# Patient Record
Sex: Female | Born: 1970 | Race: Black or African American | Hispanic: No | Marital: Married | Smoking: Never smoker
Health system: Southern US, Community
[De-identification: ages and names within clinical notes are randomized; demographics above are authoritative.]

## PROBLEM LIST (undated history)

## (undated) DIAGNOSIS — I10 Essential (primary) hypertension: Secondary | ICD-10-CM

## (undated) DIAGNOSIS — W3400XA Accidental discharge from unspecified firearms or gun, initial encounter: Secondary | ICD-10-CM

## (undated) DIAGNOSIS — D649 Anemia, unspecified: Secondary | ICD-10-CM

## (undated) DIAGNOSIS — A64 Unspecified sexually transmitted disease: Secondary | ICD-10-CM

## (undated) HISTORY — DX: Essential (primary) hypertension: I10

## (undated) HISTORY — PX: TUBAL LIGATION: SHX77

## (undated) HISTORY — DX: Unspecified sexually transmitted disease: A64

## (undated) HISTORY — DX: Anemia, unspecified: D64.9

---

## 2011-08-04 ENCOUNTER — Emergency Department (HOSPITAL_COMMUNITY): Payer: Managed Care, Other (non HMO)

## 2011-08-04 ENCOUNTER — Inpatient Hospital Stay (HOSPITAL_COMMUNITY)
Admission: EM | Admit: 2011-08-04 | Discharge: 2011-08-07 | DRG: 074 | Disposition: A | Discharge status: Home Health | Payer: Managed Care, Other (non HMO) | Attending: General Surgery | Admitting: General Surgery

## 2011-08-04 DIAGNOSIS — S91009A Unspecified open wound, unspecified ankle, initial encounter: Secondary | ICD-10-CM

## 2011-08-04 DIAGNOSIS — S79929A Unspecified injury of unspecified thigh, initial encounter: Secondary | ICD-10-CM

## 2011-08-04 DIAGNOSIS — Z79899 Other long term (current) drug therapy: Secondary | ICD-10-CM

## 2011-08-04 DIAGNOSIS — S5410XA Injury of median nerve at forearm level, unspecified arm, initial encounter: Principal | ICD-10-CM | POA: Diagnosis present

## 2011-08-04 DIAGNOSIS — S8990XA Unspecified injury of unspecified lower leg, initial encounter: Secondary | ICD-10-CM

## 2011-08-04 DIAGNOSIS — S41109A Unspecified open wound of unspecified upper arm, initial encounter: Secondary | ICD-10-CM

## 2011-08-04 DIAGNOSIS — S61409A Unspecified open wound of unspecified hand, initial encounter: Secondary | ICD-10-CM | POA: Diagnosis present

## 2011-08-04 DIAGNOSIS — W3400XA Accidental discharge from unspecified firearms or gun, initial encounter: Secondary | ICD-10-CM

## 2011-08-04 DIAGNOSIS — Y92009 Unspecified place in unspecified non-institutional (private) residence as the place of occurrence of the external cause: Secondary | ICD-10-CM

## 2011-08-04 DIAGNOSIS — S5411XA Injury of median nerve at forearm level, right arm, initial encounter: Secondary | ICD-10-CM

## 2011-08-04 DIAGNOSIS — Y249XXA Unspecified firearm discharge, undetermined intent, initial encounter: Secondary | ICD-10-CM

## 2011-08-04 DIAGNOSIS — S81009A Unspecified open wound, unspecified knee, initial encounter: Secondary | ICD-10-CM

## 2011-08-04 DIAGNOSIS — S71009A Unspecified open wound, unspecified hip, initial encounter: Secondary | ICD-10-CM | POA: Diagnosis present

## 2011-08-04 DIAGNOSIS — I10 Essential (primary) hypertension: Secondary | ICD-10-CM

## 2011-08-04 DIAGNOSIS — S71109A Unspecified open wound, unspecified thigh, initial encounter: Secondary | ICD-10-CM | POA: Diagnosis present

## 2011-08-04 DIAGNOSIS — D62 Acute posthemorrhagic anemia: Secondary | ICD-10-CM

## 2011-08-04 DIAGNOSIS — S6990XA Unspecified injury of unspecified wrist, hand and finger(s), initial encounter: Secondary | ICD-10-CM

## 2011-08-04 DIAGNOSIS — S81809A Unspecified open wound, unspecified lower leg, initial encounter: Secondary | ICD-10-CM

## 2011-08-04 HISTORY — DX: Essential (primary) hypertension: I10

## 2011-08-04 LAB — CBC
HCT: 31.4 % — ABNORMAL LOW (ref 36.0–46.0)
MCH: 23 pg — ABNORMAL LOW (ref 26.0–34.0)
MCH: 22.8 pg — ABNORMAL LOW (ref 26.0–34.0)
MCHC: 31.8 g/dL (ref 30.0–36.0)
MCV: 71.5 fL — ABNORMAL LOW (ref 78.0–100.0)
Platelets: 169 10*3/uL (ref 150–400)
RBC: 4.52 MIL/uL (ref 3.87–5.11)
RDW: 16.3 % — ABNORMAL HIGH (ref 11.5–15.5)
RDW: 16.4 % — ABNORMAL HIGH (ref 11.5–15.5)
WBC: 14.4 10*3/uL — ABNORMAL HIGH (ref 4.0–10.5)

## 2011-08-04 LAB — URINALYSIS, MICROSCOPIC ONLY
Bilirubin Urine: NEGATIVE
Ketones, ur: NEGATIVE mg/dL
Leukocytes, UA: NEGATIVE
Nitrite: NEGATIVE
Specific Gravity, Urine: 1.02 (ref 1.005–1.030)
Urobilinogen, UA: 2 mg/dL — ABNORMAL HIGH (ref 0.0–1.0)
pH: 7.5 (ref 5.0–8.0)

## 2011-08-04 LAB — PROTIME-INR
INR: 1.09 (ref 0.00–1.49)
Prothrombin Time: 14.3 seconds (ref 11.6–15.2)

## 2011-08-04 LAB — TYPE AND SCREEN
ABO/RH(D): B POS
Antibody Screen: NEGATIVE
Unit division: 0

## 2011-08-04 LAB — POCT I-STAT, CHEM 8
Creatinine, Ser: 0.8 mg/dL (ref 0.50–1.10)
HCT: 33 % — ABNORMAL LOW (ref 36.0–46.0)
Hemoglobin: 11.2 g/dL — ABNORMAL LOW (ref 12.0–15.0)
Potassium: 3.5 mEq/L (ref 3.5–5.1)
Sodium: 142 mEq/L (ref 135–145)
TCO2: 23 mmol/L (ref 0–100)

## 2011-08-04 LAB — COMPREHENSIVE METABOLIC PANEL
Alkaline Phosphatase: 69 U/L (ref 39–117)
BUN: 11 mg/dL (ref 6–23)
CO2: 24 mEq/L (ref 19–32)
Chloride: 106 mEq/L (ref 96–112)
Creatinine, Ser: 0.76 mg/dL (ref 0.50–1.10)
GFR calc non Af Amer: >90 mL/min (ref >90)
Potassium: 3.5 mEq/L (ref 3.5–5.1)
Total Bilirubin: 0.3 mg/dL (ref 0.3–1.2)

## 2011-08-04 LAB — ABO/RH: ABO/RH(D): B POS

## 2011-08-04 LAB — SAMPLE TO BLOOD BANK

## 2011-08-04 MED ORDER — HYDROMORPHONE HCL PF 1 MG/ML IJ SOLN
1.0000 mg | INTRAMUSCULAR | Status: DC | PRN
  Administered 2011-08-04 – 2011-08-05 (×5): 1 mg via INTRAVENOUS
  Filled 2011-08-04 (×5): qty 1

## 2011-08-04 MED ORDER — HYDROCODONE-ACETAMINOPHEN 5-325 MG PO TABS
2.0000 | ORAL_TABLET | ORAL | Status: DC | PRN
  Administered 2011-08-05 – 2011-08-07 (×5): 2 via ORAL
  Filled 2011-08-04 (×4): qty 2

## 2011-08-04 MED ORDER — ONDANSETRON HCL 4 MG/2ML IJ SOLN: 4.0000 mg | Freq: Four times a day (QID) | INTRAMUSCULAR | Status: DC | PRN

## 2011-08-04 MED ORDER — ENOXAPARIN SODIUM 40 MG/0.4ML ~~LOC~~ SOLN
40.0000 mg | SUBCUTANEOUS | Status: DC
  Administered 2011-08-04 – 2011-08-06 (×3): 40 mg via SUBCUTANEOUS
  Filled 2011-08-04 (×4): qty 0.4

## 2011-08-04 MED ORDER — ONDANSETRON HCL 4 MG PO TABS: 4.0000 mg | ORAL_TABLET | Freq: Four times a day (QID) | ORAL | Status: DC | PRN

## 2011-08-04 MED ORDER — TETANUS-DIPHTH-ACELL PERTUSSIS 5-2.5-18.5 LF-MCG/0.5 IM SUSP
0.5000 mL | Freq: Once | INTRAMUSCULAR | Status: AC
  Administered 2011-08-04: 0.5 mL via INTRAMUSCULAR

## 2011-08-04 MED ORDER — SODIUM CHLORIDE 0.9 % IV SOLN
INTRAVENOUS | Status: DC
  Administered 2011-08-04: 16:00:00 via INTRAVENOUS
  Filled 2011-08-04 (×2): qty 1000

## 2011-08-04 MED ORDER — MORPHINE SULFATE 2 MG/ML IJ SOLN: 2.0000 mg | INTRAMUSCULAR | Status: DC | PRN

## 2011-08-04 MED ORDER — FENTANYL CITRATE 0.05 MG/ML IJ SOLN
INTRAMUSCULAR | Status: AC
  Filled 2011-08-04: qty 2

## 2011-08-04 MED ORDER — ACETAMINOPHEN 325 MG PO TABS: 650.0000 mg | ORAL_TABLET | ORAL | Status: DC | PRN

## 2011-08-04 MED ORDER — DOCUSATE SODIUM 100 MG PO CAPS
100.0000 mg | ORAL_CAPSULE | Freq: Two times a day (BID) | ORAL | Status: DC
  Administered 2011-08-04 – 2011-08-07 (×7): 100 mg via ORAL
  Filled 2011-08-04 (×9): qty 1

## 2011-08-04 MED ORDER — BISACODYL 10 MG RE SUPP: 10.0000 mg | Freq: Every day | RECTAL | Status: DC | PRN

## 2011-08-04 MED ORDER — FENTANYL CITRATE 0.05 MG/ML IJ SOLN
50.0000 ug | Freq: Once | INTRAMUSCULAR | Status: AC
  Administered 2011-08-04: 50 ug via INTRAVENOUS

## 2011-08-04 MED ORDER — LISINOPRIL 10 MG PO TABS
10.0000 mg | ORAL_TABLET | Freq: Every day | ORAL | Status: DC
  Administered 2011-08-04 – 2011-08-07 (×4): 10 mg via ORAL
  Filled 2011-08-04 (×4): qty 1

## 2011-08-04 MED ORDER — HYDROCODONE-ACETAMINOPHEN 5-325 MG PO TABS
1.0000 | ORAL_TABLET | ORAL | Status: DC | PRN
Start: 2011-08-04 — End: 2011-08-07
  Administered 2011-08-05: 1 via ORAL
  Filled 2011-08-04: qty 1
  Filled 2011-08-04: qty 2

## 2011-08-04 MED ORDER — FENTANYL CITRATE 0.05 MG/ML IJ SOLN
50.0000 ug | Freq: Once | INTRAMUSCULAR | Status: AC
  Administered 2011-08-04: 50 ug via INTRAVENOUS
  Filled 2011-08-04: qty 2

## 2011-08-04 MED ORDER — CEFAZOLIN SODIUM 1-5 GM-% IV SOLN
1.0000 g | Freq: Three times a day (TID) | INTRAVENOUS | Status: AC
  Administered 2011-08-04 – 2011-08-05 (×3): 1 g via INTRAVENOUS
  Filled 2011-08-04 (×3): qty 50

## 2011-08-04 NOTE — ED Notes (Signed)
Medicated
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
## 2011-08-04 NOTE — ED Notes (Signed)
Wound care provided by techs.
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
## 2011-08-04 NOTE — ED Notes (Signed)
Asked pt for urine said she "could not go right now"
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
## 2011-08-04 NOTE — ED Notes (Signed)
Pt in and out for urine, tampon removed and placed in maternity underwear and pad. nad ntoed, given blanket, pillow and sheet and footies.
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
## 2011-08-05 DIAGNOSIS — D62 Acute posthemorrhagic anemia: Secondary | ICD-10-CM | POA: Diagnosis not present

## 2011-08-05 LAB — BASIC METABOLIC PANEL WITH GFR
BUN: 14 mg/dL (ref 6–23)
CO2: 26 meq/L (ref 19–32)
Calcium: 8.8 mg/dL (ref 8.4–10.5)
Chloride: 103 meq/L (ref 96–112)
Creatinine, Ser: 0.72 mg/dL (ref 0.50–1.10)
GFR calc Af Amer: >90 mL/min
GFR calc non Af Amer: >90 mL/min
Glucose, Bld: 105 mg/dL — ABNORMAL HIGH (ref 70–99)
Potassium: 4.1 meq/L (ref 3.5–5.1)
Sodium: 137 meq/L (ref 135–145)

## 2011-08-05 LAB — CBC
HCT: 29.6 % — ABNORMAL LOW (ref 36.0–46.0)
Hemoglobin: 9.2 g/dL — ABNORMAL LOW (ref 12.0–15.0)
MCH: 22.4 pg — ABNORMAL LOW (ref 26.0–34.0)
MCHC: 31.1 g/dL (ref 30.0–36.0)
MCV: 72 fL — ABNORMAL LOW (ref 78.0–100.0)
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
## 2011-08-05 NOTE — Progress Notes (Signed)
Physical Therapy Evaluation Patient Details Name: Patricia Barker MRN: [id number] DOB: 19-Jun-1971 Today's Date: 08/05/2011  Problem List: There is no problem list on file for this patient.   Past Medical History:  Past Medical History  Diagnosis Date  . Hypertension    Past Surgical History:  Past Surgical History  Procedure Date  . Tubal ligation     PT Assessment/Plan/Recommendation PT Assessment Clinical Impression Statement: Pt requires approximately moderate assistance for transfers and very short ambulation distances. Expect pt will progress quickly. Pt's daughters are in their mid 36s and can help pt 24 hours a day when she gets home. Will adjust DME as pt progresses.  PT Recommendation/Assessment: Patient will need skilled PT in the acute care venue PT Problem List: Decreased strength;Decreased activity tolerance;Decreased balance;Decreased mobility;Decreased knowledge of use of DME;Impaired sensation PT Therapy Diagnosis : Difficulty walking;Abnormality of gait;Generalized weakness;Acute pain PT Plan PT Frequency: Min 6X/week PT Treatment/Interventions: DME instruction;Gait training;Functional mobility training;Therapeutic exercise;Balance training;Patient/family education PT Recommendation Follow Up Recommendations: Home health PT Equipment Recommended:  (Rolling walker with 5" wheels with Rt.UE platform) PT Goals  Acute Rehab PT Goals PT Goal Formulation: With patient Time For Goal Achievement: 2 weeks Pt will go Supine/Side to Sit: with modified independence PT Goal: Supine/Side to Sit - Progress: Progressing toward goal Pt will go Sit to Supine/Side: with modified independence PT Goal: Sit to Supine/Side - Progress: Progressing toward goal Pt will Transfer Bed to Chair/Chair to Bed: with supervision PT Transfer Goal: Bed to Chair/Chair to Bed - Progress: Progressing toward goal Pt will Ambulate: 16 - 50 feet PT Goal: Ambulate - Progress: Progressing toward  goal  PT Evaluation Precautions/Restrictions  Restrictions Weight Bearing Restrictions: Yes RLE Weight Bearing: Weight bearing as tolerated LLE Weight Bearing: Weight bearing as tolerated Prior Functioning  Home Living Lives With: Family (3 children) Receives Help From: Family Type of Home: House Home Layout: Two level;Full bath on main level;Able to live on main level with bedroom/bathroom Alternate Level Stairs-Rails: Right Alternate Level Stairs-Number of Steps: 12 Home Access: Level entry Bathroom Shower/Tub: Engineer, manufacturing systems: Standard Home Adaptive Equipment: None Prior Function Level of Independence: Independent with basic ADLs Driving: Yes Vocation: Full time employment 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 Grossly >3/5 LLE Assessment LLE Assessment: Exceptions to [facility] LLE Strength LLE Overall Strength: Deficits;Due to pain LLE Overall Strength Comments: Generalized decreased strength, difficult to assess secondary to pain. Grossly >3/5 Mobility (including Balance) Bed Mobility Bed Mobility: Yes Supine to Sit: 4: Min assist Supine to Sit Details (indicate cue type and reason): Verbal and tactile cues for sequence, use of Rt. elbow to advance self. Min Assist secondary to pain and weakness. Sitting - Scoot to Edge of Bed: 5: Supervision Sitting - Scoot to [place] of Bed Details (indicate cue type and reason): Verbal cues for sequence and to continue without assistance. (Pt requesting help from her  daughter) Transfers Transfers: Yes Sit to Stand: 3: Mod assist Sit to Stand Details (indicate cue type and reason): Verbal and tactile cues for Bil. UE placement Stand to Sit: 3: Mod assist Stand to Sit Details:  Pt anxious, Verbal and tactile cues for Bil. UE  placement, use of armrests Ambulation/Gait Ambulation/Gait: Yes Ambulation/Gait Assistance: 3: Mod assist Ambulation/Gait Assistance Details (indicate cue type and reason): Verbal cues to increase weight bearing to Lt. LE, verbal cues for use of platform walker.  Ambulation Distance (Feet): 4 Feet Assistive device: Right platform walker Gait Pattern: Decreased weight shift to left;Decreased stance time - left;Decreased stride length;Decreased step length - left (Pt tends to wiggle Rt.foot forwards and not take a step. ) Stairs: No Wheelchair Mobility Wheelchair Mobility: No  Posture/Postural Control Posture/Postural Control: No significant limitations Balance Balance Assessed: Yes Static Sitting Balance Static Sitting - Balance Support: No upper extremity supported;Feet supported Static Sitting - Level of Assistance: 6: Modified independent (Device/Increase time) Exercise    End of Session PT - End of Session Equipment Utilized During Treatment: Gait belt Activity Tolerance: Patient limited by pain;Patient limited by fatigue Patient left: in chair;with call bell in reach;with family/visitor present Nurse Communication: Mobility status for transfers;Mobility status for ambulation;Weight bearing status (need for platform walker, transfer to Rt. ) General Behavior During Session: [facility] for tasks performed Cognition: [facility] for tasks performed  Cheek, Dahlia Client Carleene Mains, Salome Coz[place]) 08/05/2011, 9:07 AM

## 2011-08-06 DIAGNOSIS — S5411XA Injury of median nerve at forearm level, right arm, initial encounter: Secondary | ICD-10-CM | POA: Diagnosis present

## 2011-08-06 DIAGNOSIS — I10 Essential (primary) hypertension: Secondary | ICD-10-CM | POA: Insufficient documentation

## 2011-08-06 MED ORDER — HYDROMORPHONE HCL PF 1 MG/ML IJ SOLN: 0.5000 mg | INTRAMUSCULAR | Status: DC | PRN

## 2011-08-06 NOTE — Progress Notes (Signed)
Patient examined and I agree with the assessment and plan I notified RN care manager of form patient needs for work  [facility] E

## 2011-08-06 NOTE — Progress Notes (Signed)
Occupational Therapy Evaluation Patient Details Name: Patricia Barker MRN: [id number] DOB: April 17, 1971 Today's Date: 08/06/2011  OT Assessment/Plan OT Assessment/Plan Comments on Treatment Session: Pt wanted to focus session on R hand (Pt declined any sit to stand activity with OT. ) OT Plan: Discharge plan remains appropriate OT Frequency: Min 2X/week Equipment Recommended: Rolling walker with 5" wheels (Rolling walker with 5" wheels with Rt.UE platform) OT Goals ADL Goals Pt Will Transfer to Toilet: Other (comment) (pt declined toilet transfer) ADL Goal: Toilet Transfer - Progress: Progressing toward goals Arm Goals Pt Will Perform AROM: 2 sets;10 reps;to maintain range of motion (to decrease and prevent swelling) Arm Goal: AROM - Progress: Progressing toward goal Additional Arm Goal #1: R hand was not elevated on pillow upon OT entering the room.  Educated pt on importance of elevation to prevent swelling inthe fingers  OT Treatment Precautions/Restrictions  Restrictions Weight Bearing Restrictions: Yes RUE Weight Bearing: Weight bear through elbow only RLE Weight Bearing: Weight bearing as tolerated LLE Weight Bearing: Weight bearing as tolerated   ADL   Mobility  Bed Mobility Bed Mobility: Yes Supine to Sit: 5: Supervision Supine to Sit Details (indicate cue type and reason): Verbal cues for efficiency Sitting - Scoot to Edge of Bed: 5: Supervision Sitting - Scoot to [place] of Bed Details (indicate cue type and reason): Verbal cues for efficiency and placement of UEs  Transfers Sit to Stand: 4: Min assist Sit to Stand Details (indicate cue type and reason): Verbal cues for pushing from bed with Lt.UE, negotiating platform attachment Stand to Sit: 5: Supervision Stand to Sit Details: Verbal cues for pushing from bed with Lt.UE, negotiating platform attachment Exercises General Exercises - Lower Extremity Hand Exercises Digit Composite Flexion: AROM;10  reps;Right;Seated Composite Extension: AROM;10 reps;Seated Thumb Abduction: 10 reps;AROM;Seated Thumb Adduction: AAROM;10 reps;Seated  End of Session OT - End of Session Activity Tolerance: Patient tolerated treatment well Patient left: in chair General Behavior During Session: [facility] for tasks performed Cognition: [facility] for tasks performed  Kirt Boys  08/06/2011, 2:26 PM
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
## 2011-08-07 LAB — CBC
MCH: 22.3 pg — ABNORMAL LOW (ref 26.0–34.0)
MCHC: 31.1 g/dL (ref 30.0–36.0)
MCV: 71.8 fL — ABNORMAL LOW (ref 78.0–100.0)
Platelets: 215 10*3/uL (ref 150–400)
RBC: 3.94 MIL/uL (ref 3.87–5.11)
RDW: 16.4 % — ABNORMAL HIGH (ref 11.5–15.5)

## 2011-08-07 MED ORDER — PROMETHAZINE HCL 12.5 MG PO TABS: 12.5000 mg | ORAL_TABLET | Freq: Four times a day (QID) | ORAL | Status: DC | PRN

## 2011-08-07 MED ORDER — HYDROCODONE-ACETAMINOPHEN 5-325 MG PO TABS: 1.0000 | ORAL_TABLET | ORAL | Status: DC | PRN

## 2011-08-07 NOTE — Progress Notes (Signed)
Spoke with the patient who is prepared to go home with her daughter, to stay at her cousin's home.  This patient has been seen and I agree with the findings and treatment plan.  Marta Lamas. Gae Bon, MD, FACS [phone] (pager) [phone] (direct pager) Trauma Surgeon

## 2011-08-07 NOTE — Progress Notes (Signed)
Occupational Therapy Evaluation Patient Details Name: Patricia Barker MRN: [id number] DOB: 1971-07-02 Today's Date: 08/07/2011  OT Assessment/Plan OT Assessment/Plan Comments on Treatment Session: Pt. focused session on R hand and declined any out of chair activity. OT Plan: Discharge plan remains appropriate OT Frequency: Min 2X/week Follow Up Recommendations: Home health OT Equipment Recommended: 3 in 1 bedside comode OT Goals ADL Goals Pt Will Transfer to Toilet: Other (comment) (pt. declined toilet transfer) ADL Goal: Toilet Transfer - Progress: Not addressed ADL Goal: Tub/Shower Transfer - Progress: Not addressed Arm Goals Arm Goal: AROM - Progress: Progressing toward goal Additional Arm Goal #1: Rt. hand not elevated on pillow when OT arrives.  OT re-educated patient on edema control and elevated right hand on pillow.  OT Treatment Precautions/Restrictions  Restrictions RUE Weight Bearing: Weight bear through elbow only RLE Weight Bearing: Weight bearing as tolerated LLE Weight Bearing: Weight bearing as tolerated   ADL ADL Eating/Feeding: Not assessed Grooming: Not assessed Upper Body Bathing: Not assessed Lower Body Bathing: Not assessed Upper Body Dressing: Not assessed Lower Body Dressing: Not assessed Toilet Transfer: Not assessed Toileting - Clothing Manipulation: Not assessed Toileting - Hygiene: Not assessed Tub/Shower Transfer: Not assessed ADL Comments: Pt. declines ADLs this session.  She and her family are waiting to go home. Mobility  Bed Mobility Bed Mobility: No Transfers Transfers: No Exercises Hand Exercises Digit Composite Flexion: AROM;10 reps;Right;Seated (pt. reports doing exercises 10 times/day) Composite Extension: AROM;Right;10 reps;Seated Thumb Abduction: AAROM;10 reps;Right;Seated Thumb Adduction: AAROM;Right;10 reps;Seated  End of Session OT - End of Session Activity Tolerance: Patient tolerated treatment well Patient left:  in chair;with call bell in reach;with family/visitor present Nurse Communication: Other (comment) (Notified RN for pain meds) General Behavior During Session: [facility] for tasks performed Cognition: [facility] for tasks performed  Victorhugo Preis Kirsten Beatrice-Mitchell  08/07/2011, 4:26 PM  08/07/2011 Kailena Lubas K. Beatrice-Mitchell, MS, OTR/L Occupational Therapist Acute Rehabilitation [facility] [place] Phone: [phone] Pager: [phone] [email or web address]
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
## 2011-08-07 NOTE — Progress Notes (Signed)
Pt presented with choices for HH/DME.  Instructed that provider contract for DME is with [employer]. Pt agreeable to that. HH with [facility] arranged, HHPT. 3-n-1 and right platform rolling walker arranged. Fax sent to [employer]. Receipt confirmed by telephone.  Short term disability papers completed and signed by Jimmye Norman, MD.  Determination of at least 4 months out of work given by Dr. Betha Loa initially until he can reassess wounds at follow up appointment.
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
## 2011-08-07 NOTE — Progress Notes (Signed)
Call placed to the number provided from patient for her short term disability and FMLA paperwork.  Left my pager number and the trauma office fax number for her to be abke to contact me or to just fax the info.   Number from pt:  Patricia Barker at [place], [phone] [employer] ID [id number] [phone] Health Plan [id number] Group [phone]

## 2011-08-13 ENCOUNTER — Encounter (HOSPITAL_COMMUNITY): Payer: Self-pay | Admitting: Emergency Medicine

## 2011-08-13 ENCOUNTER — Emergency Department (HOSPITAL_COMMUNITY)
Admission: EM | Admit: 2011-08-13 | Discharge: 2011-08-13 | Disposition: A | Discharge status: Home / Self Care | Payer: Managed Care, Other (non HMO) | Attending: Emergency Medicine | Admitting: Emergency Medicine

## 2011-08-13 ENCOUNTER — Emergency Department (HOSPITAL_COMMUNITY): Payer: Managed Care, Other (non HMO)

## 2011-08-13 DIAGNOSIS — S71109A Unspecified open wound, unspecified thigh, initial encounter: Secondary | ICD-10-CM | POA: Insufficient documentation

## 2011-08-13 DIAGNOSIS — I10 Essential (primary) hypertension: Secondary | ICD-10-CM | POA: Insufficient documentation

## 2011-08-13 DIAGNOSIS — S71009A Unspecified open wound, unspecified hip, initial encounter: Secondary | ICD-10-CM | POA: Insufficient documentation

## 2011-08-13 DIAGNOSIS — M79609 Pain in unspecified limb: Secondary | ICD-10-CM | POA: Insufficient documentation

## 2011-08-13 DIAGNOSIS — S79929A Unspecified injury of unspecified thigh, initial encounter: Secondary | ICD-10-CM

## 2011-08-13 MED ORDER — OXYCODONE-ACETAMINOPHEN 5-325 MG PO TABS: 2.0000 | ORAL_TABLET | ORAL | Status: DC | PRN
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
## 2011-08-13 NOTE — ED Notes (Signed)
Pt resting quietly and reports that she is ready to go home.

## 2011-08-22 ENCOUNTER — Ambulatory Visit (INDEPENDENT_AMBULATORY_CARE_PROVIDER_SITE_OTHER): Payer: Managed Care, Other (non HMO) | Admitting: Orthopedic Surgery

## 2011-08-22 ENCOUNTER — Encounter (INDEPENDENT_AMBULATORY_CARE_PROVIDER_SITE_OTHER): Payer: Self-pay

## 2011-08-22 VITALS — BP 118/88 | HR 68 | Temp 97.8°F | Resp 18 | Ht 67.0 in | Wt 217.0 lb

## 2011-08-22 DIAGNOSIS — T148XXA Other injury of unspecified body region, initial encounter: Secondary | ICD-10-CM

## 2011-08-22 DIAGNOSIS — W3400XA Accidental discharge from unspecified firearms or gun, initial encounter: Secondary | ICD-10-CM
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
## 2011-12-15 ENCOUNTER — Emergency Department (HOSPITAL_COMMUNITY)
Admission: EM | Admit: 2011-12-15 | Discharge: 2011-12-15 | Disposition: A | Discharge status: Home / Self Care | Payer: Managed Care, Other (non HMO) | Attending: Emergency Medicine | Admitting: Emergency Medicine

## 2011-12-15 ENCOUNTER — Encounter (HOSPITAL_COMMUNITY): Payer: Self-pay

## 2011-12-15 DIAGNOSIS — M545 Low back pain, unspecified: Secondary | ICD-10-CM | POA: Insufficient documentation

## 2011-12-15 DIAGNOSIS — Y92410 Unspecified street and highway as the place of occurrence of the external cause: Secondary | ICD-10-CM | POA: Insufficient documentation

## 2011-12-15 DIAGNOSIS — I10 Essential (primary) hypertension: Secondary | ICD-10-CM | POA: Insufficient documentation

## 2011-12-15 HISTORY — DX: Accidental discharge from unspecified firearms or gun, initial encounter: W34.00XA

## 2011-12-15 MED ORDER — NAPROXEN 500 MG PO TABS: 500.0000 mg | ORAL_TABLET | Freq: Two times a day (BID) | ORAL | Status: AC

## 2011-12-15 MED ORDER — CYCLOBENZAPRINE HCL 10 MG PO TABS: 10.0000 mg | ORAL_TABLET | Freq: Two times a day (BID) | ORAL | Status: AC | PRN
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
## 2011-12-15 NOTE — Discharge Instructions (Signed)
Will be sore for several days. No x-rays are necessary. Medication for pain and muscle spasm.
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
## 2017-02-10 ENCOUNTER — Encounter (HOSPITAL_COMMUNITY): Payer: Managed Care, Other (non HMO) | Attending: Oncology | Admitting: Oncology

## 2017-02-10 ENCOUNTER — Encounter (HOSPITAL_COMMUNITY): Payer: Managed Care, Other (non HMO)

## 2017-02-10 ENCOUNTER — Encounter (HOSPITAL_COMMUNITY): Payer: Self-pay

## 2017-02-10 DIAGNOSIS — N920 Excessive and frequent menstruation with regular cycle: Secondary | ICD-10-CM | POA: Diagnosis not present

## 2017-02-10 DIAGNOSIS — D509 Iron deficiency anemia, unspecified: Secondary | ICD-10-CM | POA: Diagnosis not present

## 2017-02-10 LAB — CBC WITH DIFFERENTIAL/PLATELET
BASOS PCT: 1 %
Basophils Absolute: 0.1 10*3/uL (ref 0.0–0.1)
EOS ABS: 0.1 10*3/uL (ref 0.0–0.7)
Eosinophils Relative: 1 %
HEMATOCRIT: 32.4 % — AB (ref 36.0–46.0)
HEMOGLOBIN: 9.7 g/dL — AB (ref 12.0–15.0)
LYMPHS PCT: 27 %
Lymphs Abs: 1.9 10*3/uL (ref 0.7–4.0)
MCH: 19.6 pg — AB (ref 26.0–34.0)
MCHC: 29.9 g/dL — AB (ref 30.0–36.0)
MCV: 65.3 fL — AB (ref 78.0–100.0)
MONOS PCT: 6 %
Monocytes Absolute: 0.4 10*3/uL (ref 0.1–1.0)
NEUTROS ABS: 4.7 10*3/uL (ref 1.7–7.7)
Neutrophils Relative %: 65 %
Platelets: 230 10*3/uL (ref 150–400)
RBC: 4.96 MIL/uL (ref 3.87–5.11)
RDW: 21.5 % — ABNORMAL HIGH (ref 11.5–15.5)
WBC: 7.2 10*3/uL (ref 4.0–10.5)

## 2017-02-10 LAB — COMPREHENSIVE METABOLIC PANEL
ALK PHOS: 50 U/L (ref 38–126)
ALT: 13 U/L — ABNORMAL LOW (ref 14–54)
ANION GAP: 6 (ref 5–15)
AST: 15 U/L (ref 15–41)
Albumin: 3.7 g/dL (ref 3.5–5.0)
BILIRUBIN TOTAL: 0.4 mg/dL (ref 0.3–1.2)
BUN: 8 mg/dL (ref 6–20)
CO2: 25 mmol/L (ref 22–32)
Calcium: 8.8 mg/dL — ABNORMAL LOW (ref 8.9–10.3)
Chloride: 105 mmol/L (ref 101–111)
Creatinine, Ser: 0.75 mg/dL (ref 0.44–1.00)
Glucose, Bld: 96 mg/dL (ref 65–99)
Potassium: 4 mmol/L (ref 3.5–5.1)
Sodium: 136 mmol/L (ref 135–145)
TOTAL PROTEIN: 7.5 g/dL (ref 6.5–8.1)

## 2017-02-10 LAB — IRON AND TIBC
Iron: 24 ug/dL — ABNORMAL LOW (ref 28–170)
SATURATION RATIOS: 6 % — AB (ref 10.4–31.8)
TIBC: 393 ug/dL (ref 250–450)
UIBC: 369 ug/dL

## 2017-02-10 LAB — FOLATE: Folate: 14.7 ng/mL (ref >5.9)

## 2017-02-10 LAB — RETICULOCYTES
RBC.: 4.96 MIL/uL (ref 3.87–5.11)
RETIC COUNT ABSOLUTE: 59.5 10*3/uL (ref 19.0–186.0)
RETIC CT PCT: 1.2 % (ref 0.4–3.1)

## 2017-02-10 LAB — FERRITIN: Ferritin: 6 ng/mL — ABNORMAL LOW (ref 11–307)

## 2017-02-10 LAB — LACTATE DEHYDROGENASE: LDH: 115 U/L (ref 98–192)

## 2017-02-10 LAB — VITAMIN B12: Vitamin B-12: 316 pg/mL (ref 180–914)

## 2017-02-10 MED ORDER — FERROUS SULFATE 325 (65 FE) MG PO TBEC: 325.0000 mg | DELAYED_RELEASE_TABLET | Freq: Three times a day (TID) | ORAL | 5 refills | Status: DC

## 2017-02-10 NOTE — Patient Instructions (Addendum)
[place] at [facility] Discharge Instructions  RECOMMENDATIONS MADE BY THE CONSULTANT AND ANY TEST RESULTS WILL BE SENT TO YOUR REFERRING PHYSICIAN.  You were seen today by Dr. Twana First Lab work today Follow up in one week   Thank you for choosing [place] at [facility] to provide your oncology and hematology care.  To afford each patient quality time with our provider, please arrive at least 15 minutes before your scheduled appointment time.    If you have a lab appointment with the [facility] please come in thru the  Main Entrance and check in at the main information desk  You need to re-schedule your appointment should you arrive 10 or more minutes late.  We strive to give you quality time with our providers, and arriving late affects you and other patients whose appointments are after yours.  Also, if you no show three or more times for appointments you may be dismissed from the clinic at the providers discretion.     Again, thank you for choosing [facility].  Our hope is that these requests will decrease the amount of time that you wait before being seen by our physicians.       _____________________________________________________________  Should you have questions after your visit to [facility], please contact our office at [phone] between the hours of 8:30 a.m. and 4:30 p.m.  Voicemails left after 4:30 p.m. will not be returned until the following business day.  For prescription refill requests, have your pharmacy contact our office.       Resources For Cancer Patients and their Caregivers ? American Cancer Society: Can assist with transportation, wigs, general needs, runs Look Good Feel Better.        ([phone] ? Cancer Care: Provides financial assistance, online support groups, medication/co-pay assistance.  1-800-813-HOPE (336) 336-7800) ? [place] Assists  [place] cancer patients and their families through emotional , educational and financial support.  ([phone] ? [place] DSS Where to apply for food stamps, Medicaid and utility assistance. 703-009-1123 ? RCATS: Transportation to medical appointments. 307-471-6509 ? Social Security Administration: May apply for disability if have a Stage IV cancer. [phone] ? [employer], Disability and Transit Services: Assists with nutrition, care and transit needs. [place] Support Programs: @10RELATIVEDAYS @ > Cancer Support Group  2nd Tuesday of the month 1pm-2pm, Journey Room  > Creative Journey  3rd Tuesday of the month 1130am-1pm, Journey Room  > Look Good Feel Better  1st Wednesday of the month 10am-12 noon, Journey Room (Call [place] to register [phone])

## 2017-02-10 NOTE — Progress Notes (Signed)
Diane, Can you please call the patient and let her know that I got her iron studies back and they are low, so I want to try her on iron tablets first to see how her hemoglobin responds. I've sent in an Rx for ferrous sulfate 325mg  PO TID with meals. Please advise her to take a OTC stool softener should she get constipated and also to take her iron tablet with something acidic like orange juice to help with absorption. TY
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
## 2017-02-11 LAB — PROTEIN ELECTROPHORESIS, SERUM
A/G Ratio: 0.9 (ref 0.7–1.7)
ALPHA-1-GLOBULIN: 0.2 g/dL (ref 0.0–0.4)
ALPHA-2-GLOBULIN: 0.7 g/dL (ref 0.4–1.0)
Albumin ELP: 3.5 g/dL (ref 2.9–4.4)
BETA GLOBULIN: 1 g/dL (ref 0.7–1.3)
GAMMA GLOBULIN: 1.8 g/dL (ref 0.4–1.8)
Globulin, Total: 3.7 g/dL (ref 2.2–3.9)
Total Protein ELP: 7.2 g/dL (ref 6.0–8.5)

## 2017-02-11 LAB — HEMOGLOBINOPATHY EVALUATION
HGB A: 98.3 % (ref 96.4–98.8)
HGB C: 0 %
HGB F QUANT: 0 % (ref 0.0–2.0)
HGB VARIANT: 0 %
Hgb A2 Quant: 1.7 % — ABNORMAL LOW (ref 1.8–3.2)
Hgb S Quant: 0 %

## 2017-02-11 LAB — ERYTHROPOIETIN: Erythropoietin: 35.4 m[IU]/mL — ABNORMAL HIGH (ref 2.6–18.5)

## 2017-02-11 LAB — HAPTOGLOBIN: Haptoglobin: 166 mg/dL (ref 34–200)

## 2017-02-12 LAB — METHYLMALONIC ACID, SERUM: Methylmalonic Acid, Quantitative: 93 nmol/L (ref 0–378)

## 2017-02-12 LAB — IMMUNOFIXATION ELECTROPHORESIS
IgA: 85 mg/dL — ABNORMAL LOW (ref 87–352)
IgG (Immunoglobin G), Serum: 997 mg/dL (ref 700–1600)
IgM, Serum: 1132 mg/dL — ABNORMAL HIGH (ref 26–217)
Total Protein ELP: 7 g/dL (ref 6.0–8.5)

## 2017-02-18 ENCOUNTER — Encounter (HOSPITAL_BASED_OUTPATIENT_CLINIC_OR_DEPARTMENT_OTHER): Payer: Managed Care, Other (non HMO) | Admitting: Hematology

## 2017-02-18 ENCOUNTER — Encounter (HOSPITAL_COMMUNITY): Payer: Self-pay | Admitting: Hematology

## 2017-02-18 VITALS — BP 129/89 | HR 72 | Temp 98.4°F | Resp 16 | Wt 214.4 lb

## 2017-02-18 DIAGNOSIS — D890 Polyclonal hypergammaglobulinemia: Secondary | ICD-10-CM | POA: Diagnosis not present

## 2017-02-18 DIAGNOSIS — D509 Iron deficiency anemia, unspecified: Secondary | ICD-10-CM | POA: Diagnosis not present

## 2017-02-18 DIAGNOSIS — N920 Excessive and frequent menstruation with regular cycle: Secondary | ICD-10-CM

## 2017-02-18 NOTE — Patient Instructions (Signed)
[place] at [facility] Discharge Instructions  RECOMMENDATIONS MADE BY THE CONSULTANT AND ANY TEST RESULTS WILL BE SENT TO YOUR REFERRING PHYSICIAN.  You were seen today by Dr. Irene Limbo Follow up in 2 months with lab work Add a vitamin B-complex to your regimen take it daily   Thank you for choosing [place] at [facility] to provide your oncology and hematology care.  To afford each patient quality time with our provider, please arrive at least 15 minutes before your scheduled appointment time.    If you have a lab appointment with the [facility] please come in thru the  Main Entrance and check in at the main information desk  You need to re-schedule your appointment should you arrive 10 or more minutes late.  We strive to give you quality time with our providers, and arriving late affects you and other patients whose appointments are after yours.  Also, if you no show three or more times for appointments you may be dismissed from the clinic at the providers discretion.     Again, thank you for choosing [facility].  Our hope is that these requests will decrease the amount of time that you wait before being seen by our physicians.       _____________________________________________________________  Should you have questions after your visit to [facility], please contact our office at [phone] between the hours of 8:30 a.m. and 4:30 p.m.  Voicemails left after 4:30 p.m. will not be returned until the following business day.  For prescription refill requests, have your pharmacy contact our office.       Resources For Cancer Patients and their Caregivers ? American Cancer Society: Can assist with transportation, wigs, general needs, runs Look Good Feel Better.        ([phone] ? Cancer Care: Provides financial assistance, online support groups, medication/co-pay assistance.  1-800-813-HOPE  534-579-6596) ? [place] Assists [place] cancer patients and their families through emotional , educational and financial support.  925-677-0095 ? [place] DSS Where to apply for food stamps, Medicaid and utility assistance. (628)196-0672 ? RC[employer]: Transportation to medical appointments. 843 399 4972 ? Social Security Administration: May apply for disability if have a Stage IV cancer. [phone] ? [employer], Disability and Transit Services: Assists with nutrition, care and transit needs. [place] Support Programs: @10RELATIVEDAYS @ > Cancer Support Group  2nd Tuesday of the month 1pm-2pm, Journey Room  > Creative Journey  3rd Tuesday of the month 1130am-1pm, Journey Room  > Look Good Feel Better  1st Wednesday of the month 10am-12 noon, Journey Room (Call [place] to register [phone])
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
## 2017-04-08 ENCOUNTER — Encounter: Admit: 2017-04-08 | Discharge: 2017-04-08 | Payer: Private Health Insurance - Indemnity

## 2017-04-08 DIAGNOSIS — R002 Palpitations: Principal | ICD-10-CM

## 2017-04-09 ENCOUNTER — Encounter: Admit: 2017-04-09 | Discharge: 2017-04-09 | Payer: Private Health Insurance - Indemnity

## 2017-04-09 NOTE — Telephone Encounter
Received a call from Delma Officer. with [facility] scheduling requesting a new pt appointment for consult and treatment of palpitations. Referred to Dr. Tula Nakayama from Basil Dess NP.

## 2017-04-15 ENCOUNTER — Encounter (HOSPITAL_COMMUNITY): Payer: Managed Care, Other (non HMO)

## 2017-04-15 ENCOUNTER — Encounter (HOSPITAL_COMMUNITY): Payer: Managed Care, Other (non HMO) | Attending: Oncology | Admitting: Oncology

## 2017-04-15 VITALS — BP 134/78 | HR 58 | Resp 16 | Wt 217.0 lb

## 2017-04-15 DIAGNOSIS — D509 Iron deficiency anemia, unspecified: Secondary | ICD-10-CM | POA: Diagnosis not present

## 2017-04-15 DIAGNOSIS — N920 Excessive and frequent menstruation with regular cycle: Secondary | ICD-10-CM | POA: Diagnosis not present

## 2017-04-15 DIAGNOSIS — E538 Deficiency of other specified B group vitamins: Secondary | ICD-10-CM

## 2017-04-15 DIAGNOSIS — D890 Polyclonal hypergammaglobulinemia: Secondary | ICD-10-CM | POA: Diagnosis not present

## 2017-04-15 LAB — RETICULOCYTES
RBC.: 5.18 MIL/uL — ABNORMAL HIGH (ref 3.87–5.11)
Retic Count, Absolute: 41.4 10*3/uL (ref 19.0–186.0)
Retic Ct Pct: 0.8 % (ref 0.4–3.1)

## 2017-04-15 LAB — IRON AND TIBC
IRON: 56 ug/dL (ref 28–170)
Saturation Ratios: 16 % (ref 10.4–31.8)
TIBC: 342 ug/dL (ref 250–450)
UIBC: 286 ug/dL

## 2017-04-15 LAB — FERRITIN: FERRITIN: 14 ng/mL (ref 11–307)

## 2017-04-15 MED ORDER — CYANOCOBALAMIN 2000 MCG PO TABS: 2000.0000 ug | ORAL_TABLET | Freq: Every day | ORAL | 3 refills | Status: DC

## 2017-04-15 NOTE — Patient Instructions (Signed)
[place] at [facility] Discharge Instructions  RECOMMENDATIONS MADE BY THE CONSULTANT AND ANY TEST RESULTS WILL BE SENT TO YOUR REFERRING PHYSICIAN.  Seen by Dr. Talbert Cage today Return in 3 months for follow up Labs with next visit  Thank you for choosing [place] at [facility] to provide your oncology and hematology care.  To afford each patient quality time with our provider, please arrive at least 15 minutes before your scheduled appointment time.    If you have a lab appointment with the [place] [facility] please come in thru the  Main Entrance and check in at the main information desk  You need to re-schedule your appointment should you arrive 10 or more minutes late.  We strive to give you quality time with our providers, and arriving late affects you and other patients whose appointments are after yours.  Also, if you no show three or more times for appointments you may be dismissed from the clinic at the providers discretion.     Again, thank you for choosing [facility].  Our hope is that these requests will decrease the amount of time that you wait before being seen by our physicians.       _____________________________________________________________  Should you have questions after your visit to [facility], please contact our office at [phone] between the hours of 8:30 a.m. and 4:30 p.m.  Voicemails left after 4:30 p.m. will not be returned until the following business day.  For prescription refill requests, have your pharmacy contact our office.       Resources For Cancer Patients and their Caregivers ? American Cancer Society: Can assist with transportation, wigs, general needs, runs Look Good Feel Better.        (864)722-4674 ? Cancer Care: Provides financial assistance, online support groups, medication/co-pay assistance.  1-800-813-HOPE (337)511-9888) ? [facility] Assists  [place] cancer patients and their families through emotional , educational and financial support.  (937)715-0286 ? [place] DSS Where to apply for food stamps, Medicaid and utility assistance. (603) 756-5611 ? RCATS: Transportation to medical appointments. (774)726-3557 ? Social Security Administration: May apply for disability if have a Stage IV cancer. [phone] (604)641-4507 ? [employer], Disability and Transit Services: Assists with nutrition, care and transit needs. [place] Support Programs: @10RELATIVEDAYS @ > Cancer Support Group  2nd Tuesday of the month 1pm-2pm, Journey Room  > Creative Journey  3rd Tuesday of the month 1130am-1pm, Journey Room  > Look Good Feel Better  1st Wednesday of the month 10am-12 noon, Journey Room (Call [place] to register [phone])
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
## 2017-04-16 LAB — IGM: IGM, SERUM: 1137 mg/dL — AB (ref 26–217)

## 2017-07-16 ENCOUNTER — Encounter (HOSPITAL_COMMUNITY): Payer: Managed Care, Other (non HMO) | Attending: Oncology | Admitting: Oncology

## 2017-07-16 ENCOUNTER — Encounter (HOSPITAL_COMMUNITY): Payer: Self-pay

## 2017-07-16 ENCOUNTER — Encounter (HOSPITAL_COMMUNITY): Payer: Managed Care, Other (non HMO) | Attending: Oncology

## 2017-07-16 VITALS — BP 148/87 | HR 90 | Temp 98.3°F | Resp 18 | Wt 222.4 lb

## 2017-07-16 DIAGNOSIS — D890 Polyclonal hypergammaglobulinemia: Secondary | ICD-10-CM | POA: Diagnosis not present

## 2017-07-16 DIAGNOSIS — E538 Deficiency of other specified B group vitamins: Secondary | ICD-10-CM

## 2017-07-16 DIAGNOSIS — D509 Iron deficiency anemia, unspecified: Secondary | ICD-10-CM | POA: Insufficient documentation

## 2017-07-16 LAB — COMPREHENSIVE METABOLIC PANEL
ALT: 16 U/L (ref 14–54)
AST: 18 U/L (ref 15–41)
Albumin: 3.6 g/dL (ref 3.5–5.0)
Alkaline Phosphatase: 59 U/L (ref 38–126)
Anion gap: 7 (ref 5–15)
BUN: 11 mg/dL (ref 6–20)
CO2: 27 mmol/L (ref 22–32)
CREATININE: 0.76 mg/dL (ref 0.44–1.00)
Calcium: 9 mg/dL (ref 8.9–10.3)
Chloride: 105 mmol/L (ref 101–111)
GFR calc non Af Amer: >60 mL/min (ref >60)
Glucose, Bld: 128 mg/dL — ABNORMAL HIGH (ref 65–99)
POTASSIUM: 3.8 mmol/L (ref 3.5–5.1)
SODIUM: 139 mmol/L (ref 135–145)
Total Bilirubin: 0.6 mg/dL (ref 0.3–1.2)
Total Protein: 7.1 g/dL (ref 6.5–8.1)

## 2017-07-16 LAB — CBC WITH DIFFERENTIAL/PLATELET
BASOS ABS: 0 10*3/uL (ref 0.0–0.1)
Basophils Relative: 0 %
EOS ABS: 0.1 10*3/uL (ref 0.0–0.7)
EOS PCT: 2 %
HCT: 37 % (ref 36.0–46.0)
Hemoglobin: 11.9 g/dL — ABNORMAL LOW (ref 12.0–15.0)
Lymphocytes Relative: 25 %
Lymphs Abs: 1.6 10*3/uL (ref 0.7–4.0)
MCH: 24.6 pg — AB (ref 26.0–34.0)
MCHC: 32.2 g/dL (ref 30.0–36.0)
MCV: 76.6 fL — ABNORMAL LOW (ref 78.0–100.0)
MONO ABS: 0.5 10*3/uL (ref 0.1–1.0)
MONOS PCT: 8 %
Neutro Abs: 4.2 10*3/uL (ref 1.7–7.7)
Neutrophils Relative %: 65 %
PLATELETS: 168 10*3/uL (ref 150–400)
RBC: 4.83 MIL/uL (ref 3.87–5.11)
RDW: 16.2 % — AB (ref 11.5–15.5)
WBC: 6.3 10*3/uL (ref 4.0–10.5)

## 2017-07-16 LAB — FERRITIN: FERRITIN: 16 ng/mL (ref 11–307)

## 2017-07-16 LAB — IRON AND TIBC
IRON: 89 ug/dL (ref 28–170)
Saturation Ratios: 26 % (ref 10.4–31.8)
TIBC: 340 ug/dL (ref 250–450)
UIBC: 251 ug/dL

## 2017-07-16 NOTE — Patient Instructions (Signed)
[facility] at [facility] Discharge Instructions  RECOMMENDATIONS MADE BY THE CONSULTANT AND ANY TEST RESULTS WILL BE SENT TO YOUR REFERRING PHYSICIAN.  You were seen today by Dr. Louise Zhou Follow up in 3 months with labs   Thank you for choosing [facility] at [facility] to provide your oncology and hematology care.  To afford each patient quality time with our provider, please arrive at least 15 minutes before your scheduled appointment time.    If you have a lab appointment with the Cancer Center please come in thru the  Main Entrance and check in at the main information desk  You need to re-schedule your appointment should you arrive 10 or more minutes late.  We strive to give you quality time with our providers, and arriving late affects you and other patients whose appointments are after yours.  Also, if you no show three or more times for appointments you may be dismissed from the clinic at the providers discretion.     Again, thank you for choosing [facility].  Our hope is that these requests will decrease the amount of time that you wait before being seen by our physicians.       _____________________________________________________________  Should you have questions after your visit to [facility], please contact our office at [phone] between the hours of 8:30 a.m. and 4:30 p.m.  Voicemails left after 4:30 p.m. will not be returned until the following business day.  For prescription refill requests, have your pharmacy contact our office.       Resources For Cancer Patients and their Caregivers ? American Cancer Society: Can assist with transportation, wigs, general needs, runs Look Good Feel Better.        1-888-227-6333 ? Cancer Care: Provides financial assistance, online support groups, medication/co-pay assistance.  1-800-813-HOPE (4673) ? [facility] Assists  [place] cancer patients and their families through emotional , educational and financial support.  336-427-4357 ? [place] DSS Where to apply for food stamps, Medicaid and utility assistance. 336-342-1394 ? RCATS: Transportation to medical appointments. 336-347-2287 ? Social Security Administration: May apply for disability if have a Stage IV cancer. 336-342-7796 1-800-772-1213 ? [place] [employer], Disability and Transit Services: Assists with nutrition, care and transit needs. 336-349-2343  Cancer Center Support Programs: @10RELATIVEDAYS@ > Cancer Support Group  2nd Tuesday of the month 1pm-2pm, Journey Room  > Creative Journey  3rd Tuesday of the month 1130am-1pm, Journey Room  > Look Good Feel Better  1st Wednesday of the month 10am-12 noon, Journey Room (Call [employer] to register 1-800-395-5775)     
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
## 2017-10-16 ENCOUNTER — Inpatient Hospital Stay (HOSPITAL_COMMUNITY): Payer: Managed Care, Other (non HMO) | Attending: Oncology

## 2017-10-16 ENCOUNTER — Other Ambulatory Visit: Payer: Self-pay

## 2017-10-16 ENCOUNTER — Encounter (HOSPITAL_COMMUNITY): Payer: Self-pay | Admitting: Internal Medicine

## 2017-10-16 ENCOUNTER — Inpatient Hospital Stay (HOSPITAL_BASED_OUTPATIENT_CLINIC_OR_DEPARTMENT_OTHER): Payer: Managed Care, Other (non HMO) | Admitting: Internal Medicine

## 2017-10-16 VITALS — BP 133/82 | HR 58 | Temp 98.6°F | Resp 20 | Ht 67.0 in | Wt 225.0 lb

## 2017-10-16 DIAGNOSIS — D509 Iron deficiency anemia, unspecified: Secondary | ICD-10-CM | POA: Diagnosis not present

## 2017-10-16 DIAGNOSIS — E538 Deficiency of other specified B group vitamins: Secondary | ICD-10-CM | POA: Diagnosis not present

## 2017-10-16 DIAGNOSIS — N920 Excessive and frequent menstruation with regular cycle: Secondary | ICD-10-CM | POA: Insufficient documentation

## 2017-10-16 DIAGNOSIS — D472 Monoclonal gammopathy: Secondary | ICD-10-CM | POA: Diagnosis not present

## 2017-10-16 LAB — CBC WITH DIFFERENTIAL/PLATELET
Basophils Absolute: 0 10*3/uL (ref 0.0–0.1)
Basophils Relative: 0 %
EOS ABS: 0.1 10*3/uL (ref 0.0–0.7)
Eosinophils Relative: 2 %
HEMATOCRIT: 35.5 % — AB (ref 36.0–46.0)
HEMOGLOBIN: 11 g/dL — AB (ref 12.0–15.0)
LYMPHS ABS: 1 10*3/uL (ref 0.7–4.0)
LYMPHS PCT: 17 %
MCH: 23.9 pg — AB (ref 26.0–34.0)
MCHC: 31 g/dL (ref 30.0–36.0)
MCV: 77 fL — AB (ref 78.0–100.0)
MONOS PCT: 7 %
Monocytes Absolute: 0.4 10*3/uL (ref 0.1–1.0)
NEUTROS ABS: 4.3 10*3/uL (ref 1.7–7.7)
NEUTROS PCT: 74 %
Platelets: 161 10*3/uL (ref 150–400)
RBC: 4.61 MIL/uL (ref 3.87–5.11)
RDW: 15.5 % (ref 11.5–15.5)
WBC: 5.8 10*3/uL (ref 4.0–10.5)

## 2017-10-16 LAB — COMPREHENSIVE METABOLIC PANEL
ALK PHOS: 73 U/L (ref 38–126)
ALT: 17 U/L (ref 14–54)
ANION GAP: 10 (ref 5–15)
AST: 19 U/L (ref 15–41)
Albumin: 3.6 g/dL (ref 3.5–5.0)
BILIRUBIN TOTAL: 0.3 mg/dL (ref 0.3–1.2)
BUN: 16 mg/dL (ref 6–20)
CALCIUM: 8.7 mg/dL — AB (ref 8.9–10.3)
CO2: 23 mmol/L (ref 22–32)
Chloride: 104 mmol/L (ref 101–111)
Creatinine, Ser: 0.82 mg/dL (ref 0.44–1.00)
Glucose, Bld: 144 mg/dL — ABNORMAL HIGH (ref 65–99)
Potassium: 3.5 mmol/L (ref 3.5–5.1)
Sodium: 137 mmol/L (ref 135–145)
TOTAL PROTEIN: 7 g/dL (ref 6.5–8.1)

## 2017-10-16 LAB — VITAMIN B12: VITAMIN B 12: 949 pg/mL — AB (ref 180–914)

## 2017-10-16 LAB — IRON AND TIBC
Iron: 56 ug/dL (ref 28–170)
SATURATION RATIOS: 17 % (ref 10.4–31.8)
TIBC: 330 ug/dL (ref 250–450)
UIBC: 274 ug/dL

## 2017-10-16 LAB — FERRITIN: Ferritin: 17 ng/mL (ref 11–307)

## 2017-10-16 NOTE — Progress Notes (Signed)
Chief Complaint: Anemia  HISTORY OF PRESENTING ILLNESS: Patient was last seen here by Dr. Talbert Cage on 07/16/2017 for iron deficiency anemia.  She was previously prescribed iron sulfate 325 mg by mouth 3 times a day with meals. Patient tolerated that well and her hemoglobin appeared to have improved from 9.7-11.9.  She also has a history of mild vitamin B12 deficiency for which she has been on oral B12 supplements. Previously she was noted to have IgM polyclonal gammopathy without any evidence of monoclonal gammopathy repeat IFE and SPEP was planned for this visit today.  INTERVAL HISTORY: Patient presents for continued follow-up today. She continues take her ferrous sulfate 325 mg by mouth 3 times a day. She denies any side effects from taking iron including constipation and GI upset. She denies any pica symptoms. She still feels tired, no melena, change in bowel habits She endorses long standing menorrhagia, not seen a Gyn before, no prior Gi work up in chart  Today's CBC shows a decline in hemoglobin at 11 from 11.9 back in October white blood cell count is normal MCV is still low at 77 platelets are 161 CMP was unremarkable her last ferritin was 16 on 1024.   PHYSICAL EXAMINATION:  Vitals:   10/16/17 1037  BP: 133/82  Pulse: (!) 58  Resp: 20  Temp: 98.6 F (37 C)  SpO2: 100%    Filed Weights   10/16/17 1037  Weight: 225 lb (102.1 kg)     Physical Exam  Constitutional: She is oriented to person, place, and time and well-developed, well-nourished, and in no distress. Head: Normocephalic and atraumatic.  Mouth/Throat: No oropharyngeal exudate.  Cardiovascular: Normal rate, regular rhythm  Pulmonary/Chest: Breath sounds normal. No respiratory distress.  Abdominal: Soft. Lymphadenopathy:    She has no cervical adenopathy.  Neurological: She is alert and oriented to person, place, and time. No cranial nerve deficit.  Skin: Skin is warm and dry. No rash noted. No erythema. No  pallor.  Psychiatric: Affect and judgment normal.    ASSESSMENT/PLAN:  1. Iron deficiency anemia-  today's CBC shows a decline in hemoglobin at 11 from 11.9 back in October white blood cell count is normal MCV is still low at 77 platelets are 161 CMP was unremarkable her last ferritin was 16 on 10/24. Ferritin today remains low at 17 I will refer her to GI for work up for occult blood loss. She also has menorrhagia- refer her to [facility] IV iron Return in 8 weeks with labs  2. vitamin B12 deficiency- continue B12 supplements.  3. 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 Return to clinic in 3 months for follow-up with labs.    Creola Corn, MD  11/06/2017 6:24 PM

## 2017-10-17 ENCOUNTER — Inpatient Hospital Stay (HOSPITAL_COMMUNITY): Payer: Managed Care, Other (non HMO)

## 2017-10-17 ENCOUNTER — Encounter (HOSPITAL_COMMUNITY): Payer: Self-pay | Admitting: Adult Health

## 2017-10-17 VITALS — BP 133/75 | HR 48 | Temp 98.1°F | Resp 18

## 2017-10-17 DIAGNOSIS — D509 Iron deficiency anemia, unspecified: Secondary | ICD-10-CM

## 2017-10-17 DIAGNOSIS — M549 Dorsalgia, unspecified: Secondary | ICD-10-CM

## 2017-10-17 LAB — IMMUNOFIXATION ELECTROPHORESIS
IgA: 63 mg/dL — ABNORMAL LOW (ref 87–352)
IgG (Immunoglobin G), Serum: 991 mg/dL (ref 700–1600)
IgM (Immunoglobulin M), Srm: 1033 mg/dL — ABNORMAL HIGH (ref 26–217)
TOTAL PROTEIN ELP: 6.7 g/dL (ref 6.0–8.5)

## 2017-10-17 MED ORDER — FERROUS GLUCONATE 225 (27 FE) MG PO TABS: 240.0000 mg | ORAL_TABLET | Freq: Three times a day (TID) | ORAL | 3 refills | Status: DC

## 2017-10-17 MED ORDER — METHYLPREDNISOLONE SODIUM SUCC 125 MG IJ SOLR
125.0000 mg | Freq: Once | INTRAMUSCULAR | Status: AC
Start: 2017-10-17 — End: 2017-10-17
  Administered 2017-10-17: 125 mg via INTRAVENOUS

## 2017-10-17 MED ORDER — SODIUM CHLORIDE 0.9 % IV SOLN
INTRAVENOUS | Status: DC
  Administered 2017-10-17: 09:00:00 via INTRAVENOUS

## 2017-10-17 MED ORDER — SODIUM CHLORIDE 0.9 % IV SOLN
750.0000 mg | Freq: Once | INTRAVENOUS | Status: AC
  Administered 2017-10-17: 750 mg via INTRAVENOUS
  Filled 2017-10-17: qty 15

## 2017-10-17 NOTE — Patient Instructions (Addendum)
Patricia Barker at [facility] Discharge Instructions  RECOMMENDATIONS MADE BY THE CONSULTANT AND ANY TEST RESULTS WILL BE SENT TO YOUR REFERRING PHYSICIAN.  Follow up as scheduled.  Thank you for choosing [place] at [facility] to provide your oncology and hematology care.  To afford each patient quality time with our provider, please arrive at least 15 minutes before your scheduled appointment time.    If you have a lab appointment with the [facility] please come in thru the  Main Entrance and check in at the main information desk  You need to re-schedule your appointment should you arrive 10 or more minutes late.  We strive to give you quality time with our providers, and arriving late affects you and other patients whose appointments are after yours.  Also, if you no show three or more times for appointments you may be dismissed from the clinic at the providers discretion.     Again, thank you for choosing [facility].  Our hope is that these requests will decrease the amount of time that you wait before being seen by our physicians.       _____________________________________________________________  Should you have questions after your visit to [facility], please contact our office at [phone] between the hours of 8:30 a.m. and 4:30 p.m.  Voicemails left after 4:30 p.m. will not be returned until the following business day.  For prescription refill requests, have your pharmacy contact our office.       Resources For Cancer Patients and their Caregivers ? American Cancer Society: Can assist with transportation, wigs, general needs, runs Look Good Feel Better.        380 739 3285 ? Cancer Care: Provides financial assistance, online support groups, medication/co-pay assistance.  1-800-813-HOPE 407 567 8692) ? [place] Assists [place] cancer patients and their families through  emotional , educational and financial support.  401-692-4145 ? [place] DSS Where to apply for food stamps, Medicaid and utility assistance. 567-493-2620 ? [facility]: Transportation to medical appointments. 410-103-5196 ? Social Security Administration: May apply for disability if have a Stage IV cancer. 8170294153 806-569-5829 ? [employer], Disability and Transit Services: Assists with nutrition, care and transit needs. [place] Support Programs: @10RELATIVEDAYS @ > Cancer Support Group  2nd Tuesday of the month 1pm-2pm, Journey Room  > Creative Journey  3rd Tuesday of the month 1130am-1pm, Journey Room  > Look Good Feel Better  1st Wednesday of the month 10am-12 noon, Journey Room (Call [place] to register [phone])

## 2017-10-17 NOTE — Progress Notes (Signed)
0903-patient called nurse to come to room.complaining of abdominal pain radiating to her right side of her back. Injectafer stopped immediately, vitals taken, NP notified, new bag of saline started, solumedrol given per orders.   Will give 500 ml bag of saline and monitor vitals . See flowsheet for vitals and pain assessment.   Dr.Peru in to see patient at 0930.   Vitals stable, patient stated she felt ok now, no complaints, discharged home from clinic ambulatory. Follow up as scheduled.

## 2017-10-17 NOTE — Progress Notes (Signed)
NP called to infusion area by Forest Gleason, RN.    Ms. Patricia Barker receiving her 1st dose of IV iron (Injectafer).  ~5 minutes into infusion, patient experienced abdominal pain and bilateral low back pain.  VSS. Afebrile. Denies any shortness of breath, chest pain, itching, or rash.  No CVA tenderness on percussion. No abdominal pain on palpation.  Infusion stopped immediately at pt's reported symptoms.  NS bolus 500 mL and 125 mg Solumedrol administered.  Will collect UA to ensure no blood in her urine.    Could consider re-challenging with [facility] with pre-medications (tylenol, benadryl, solu-medrol) in the future.   Mike Craze, NP [place] [phone]

## 2017-10-17 NOTE — Progress Notes (Signed)
injectafer stopped at [phone]

## 2017-10-20 ENCOUNTER — Encounter: Admit: 2017-10-20 | Discharge: 2017-10-20 | Payer: Private Health Insurance - Indemnity

## 2017-10-20 LAB — PROTEIN ELECTROPHORESIS, SERUM
A/G Ratio: 1 (ref 0.7–1.7)
Albumin ELP: 3.2 g/dL (ref 2.9–4.4)
Alpha-1-Globulin: 0.2 g/dL (ref 0.0–0.4)
Alpha-2-Globulin: 0.6 g/dL (ref 0.4–1.0)
Beta Globulin: 0.8 g/dL (ref 0.7–1.3)
GLOBULIN, TOTAL: 3.2 g/dL (ref 2.2–3.9)
Gamma Globulin: 1.6 g/dL (ref 0.4–1.8)
TOTAL PROTEIN ELP: 6.4 g/dL (ref 6.0–8.5)

## 2017-10-21 ENCOUNTER — Encounter: Payer: Self-pay | Admitting: Gastroenterology

## 2017-10-24 ENCOUNTER — Inpatient Hospital Stay (HOSPITAL_COMMUNITY): Payer: Managed Care, Other (non HMO)

## 2017-10-29 ENCOUNTER — Ambulatory Visit (INDEPENDENT_AMBULATORY_CARE_PROVIDER_SITE_OTHER): Payer: Managed Care, Other (non HMO) | Admitting: Adult Health

## 2017-10-29 ENCOUNTER — Encounter: Payer: Self-pay | Admitting: Adult Health

## 2017-10-29 VITALS — BP 120/80 | HR 66 | Ht 67.0 in | Wt 220.0 lb

## 2017-10-29 DIAGNOSIS — Z862 Personal history of diseases of the blood and blood-forming organs and certain disorders involving the immune mechanism: Secondary | ICD-10-CM | POA: Diagnosis not present

## 2017-10-29 DIAGNOSIS — N920 Excessive and frequent menstruation with regular cycle: Secondary | ICD-10-CM | POA: Insufficient documentation

## 2017-10-29 NOTE — Progress Notes (Signed)
Subjective:     Patient ID: [name], female   DOB: 08/15/1971, 47 y.o.   MRN: [id number]  HPI Patricia Barker is a 47 year old black female in complaining of heavy periods, for last year.They are lasting about 10-14 days,no cramps, and changes pads about 4 x during day, but it is at night when really heavy.Had pap at [facility] year.She is anemic and has interferon injections.  PCP is Dr Legrand Rams.  Review of Systems +heavy periods No cramps Occasional hot flashes  Has seen blood in stool at times but not often Denies sexually activity at present Patient denies any daily headaches, hearing loss, fatigue, blurred vision, shortness of breath, chest pain, abdominal pain,or problems with  urination, No joint pain or mood swings.  Reviewed past medical,surgical, social and family history. Reviewed medications and allergies.     Objective:   Physical Exam BP 120/80 (BP Location: Left Arm, Patient Position: Sitting, Cuff Size: Large)   Pulse 66   Ht 5\' 7"  (1.702 m)   Wt 220 lb (99.8 kg)   LMP 10/02/2017 (Approximate)   BMI 34.46 kg/m   PHQ 2 score 0. Skin warm and dry.Pelvic: external genitalia is normal in appearance no lesions, vagina: pink with good moisture and rugae,urethra has no lesions or masses noted, cervix:smooth and bulbous, uterus: normal size, shape and contour, non tender, no masses felt, adnexa: no masses or tenderness noted. Bladder is non tender and no masses felt.    Discussed could be perimenopause, fibroids or polyps, will get [name] to assess uterus and request last pap.   Assessment:     1. Menorrhagia with regular cycle   2. History of anemia       Plan:     Return in 1 week for GYN [name]  Then see me about a week later Request last pap from [facility] in [place] Get mammogram  Review handout on menorrhagia

## 2017-10-29 NOTE — Patient Instructions (Signed)

## 2017-10-30 ENCOUNTER — Encounter (HOSPITAL_COMMUNITY): Payer: Self-pay | Admitting: *Deleted

## 2017-10-30 NOTE — Progress Notes (Signed)
FMLA paper work received 10/28/2017.  I spoke with provider and patient does not have a medical condition that we are currently treating her for that would require a medical leave of absence.  I attempted to call patient at available number to notify her of this and had to leave a message on voicemail.

## 2017-11-05 ENCOUNTER — Ambulatory Visit (INDEPENDENT_AMBULATORY_CARE_PROVIDER_SITE_OTHER): Payer: Managed Care, Other (non HMO)

## 2017-11-05 DIAGNOSIS — Z862 Personal history of diseases of the blood and blood-forming organs and certain disorders involving the immune mechanism: Secondary | ICD-10-CM

## 2017-11-05 DIAGNOSIS — D259 Leiomyoma of uterus, unspecified: Secondary | ICD-10-CM | POA: Diagnosis not present

## 2017-11-05 DIAGNOSIS — N920 Excessive and frequent menstruation with regular cycle: Secondary | ICD-10-CM

## 2017-11-05 NOTE — Progress Notes (Signed)
PELVIC US TA/TV: enlarged heterogeneous uterus w/mult fibroids,largest fibroids (#1) anterior 16.8 x 9.7 x 10.9 cm,(#2) fundal 5.2 x 4.4 x 4.3 cm,(#3) posterior 4.6 x 5.8 x 5 cm,normal ovaries bilat,unable to visualized endometrium,uterus and ovaries were better visualized on T/A images

## 2017-11-12 ENCOUNTER — Encounter: Payer: Self-pay | Admitting: Adult Health

## 2017-11-12 ENCOUNTER — Ambulatory Visit (INDEPENDENT_AMBULATORY_CARE_PROVIDER_SITE_OTHER): Payer: Managed Care, Other (non HMO) | Admitting: Adult Health

## 2017-11-12 ENCOUNTER — Other Ambulatory Visit: Payer: Self-pay

## 2017-11-12 VITALS — BP 132/78 | HR 75 | Ht 67.0 in | Wt 229.0 lb

## 2017-11-12 DIAGNOSIS — D259 Leiomyoma of uterus, unspecified: Secondary | ICD-10-CM | POA: Diagnosis not present

## 2017-11-12 DIAGNOSIS — N920 Excessive and frequent menstruation with regular cycle: Secondary | ICD-10-CM | POA: Diagnosis not present

## 2017-11-12 DIAGNOSIS — D649 Anemia, unspecified: Secondary | ICD-10-CM

## 2017-11-12 DIAGNOSIS — Z862 Personal history of diseases of the blood and blood-forming organs and certain disorders involving the immune mechanism: Secondary | ICD-10-CM

## 2017-11-12 DIAGNOSIS — D219 Benign neoplasm of connective and other soft tissue, unspecified: Secondary | ICD-10-CM

## 2017-11-12 NOTE — Progress Notes (Signed)
Subjective:     Patient ID: Patricia Barker, female   DOB: 08/07/1971, 47 y.o.   MRN: [id number]  HPI Patricia Barker is a 47 year old black female in to discuss Patricia Barker.Has had heavy periods and is anemic.   Review of Systems +heavy bleeding Reviewed past medical,surgical, social and family history. Reviewed medications and allergies.     Objective:   Physical Exam BP 132/78 (BP Location: Right Arm, Patient Position: Sitting, Cuff Size: Large)   Pulse 75   Ht 5\' 7"  (1.702 m)   Wt 229 lb (103.9 kg)   LMP 10/02/2017   BMI 35.87 kg/m   Talk only: Patricia Barker showed enlarged uterus and multiple fibroids with largest 16.8 x 9.7 x 10.9 cm, ovaries are normal could not see endometrium,ovaries normal. Had normal pap with negative HPV, 12/2016 in [place]. Will get her appt next week to schedule hysterectomy.     Assessment:     1. Fibroids   2. Menorrhagia with regular cycle   3. History of anemia       Plan:    Review handouts on fibroids and hysterectomy Return in 1 week for pre op with Dr Elonda Husky

## 2017-11-12 NOTE — Patient Instructions (Signed)
Uterine Fibroids Uterine fibroids are tissue masses (tumors) that can develop in the womb (uterus). They are also called leiomyomas. This type of tumor is not cancerous (benign) and does not spread to other parts of the body outside of the pelvic area, which is between the hip bones. Occasionally, fibroids may develop in the fallopian tubes, in the cervix, or on the support structures (ligaments) that surround the uterus. You can have one or many fibroids. Fibroids can vary in size, weight, and where they grow in the uterus. Some can become quite large. Most fibroids do not require medical treatment. What are the causes? A fibroid can develop when a single uterine cell keeps growing (replicating). Most cells in the human body have a control mechanism that keeps them from replicating without control. What are the signs or symptoms? Symptoms may include:  Heavy bleeding during your period.  Bleeding or spotting between periods.  Pelvic pain and pressure.  Bladder problems, such as needing to urinate more often (urinary frequency) or urgently.  Inability to reproduce offspring (infertility).  Miscarriages.  How is this diagnosed? Uterine fibroids are diagnosed through a physical exam. Your health care provider may feel the lumpy tumors during a pelvic exam. Ultrasonography and an MRI may be done to determine the size, location, and number of fibroids. How is this treated? Treatment may include:  Watchful waiting. This involves getting the fibroid checked by your health care provider to see if it grows or shrinks. Follow your health care provider's recommendations for how often to have this checked.  Hormone medicines. These can be taken by mouth or given through an intrauterine device (IUD).  Surgery. ? Removing the fibroids (myomectomy) or the uterus (hysterectomy). ? Removing blood supply to the fibroids (uterine artery embolization).  If fibroids interfere with your fertility and you  want to become pregnant, your health care provider may recommend having the fibroids removed. Follow these instructions at home:  Keep all follow-up visits as directed by your health care provider. This is important.  Take over-the-counter and prescription medicines only as told by your health care provider. ? If you were prescribed a hormone treatment, take the hormone medicines exactly as directed.  Ask your health care provider about taking iron pills and increasing the amount of dark green, leafy vegetables in your diet. These actions can help to boost your blood iron levels, which may be affected by heavy menstrual bleeding.  Pay close attention to your period and tell your health care provider about any changes, such as: ? Increased blood flow that requires you to use more pads or tampons than usual per month. ? A change in the number of days that your period lasts per month. ? A change in symptoms that are associated with your period, such as abdominal cramping or back pain. Contact a health care provider if:  You have pelvic pain, back pain, or abdominal cramps that cannot be controlled with medicines.  You have an increase in bleeding between and during periods.  You soak tampons or pads in a half hour or less.  You feel lightheaded, extra tired, or weak. Get help right away if:  You faint.  You have a sudden increase in pelvic pain. This information is not intended to replace advice given to you by your health care provider. Make sure you discuss any questions you have with your health care provider. Document Released: 09/06/2000 Document Revised: 05/09/2016 Document Reviewed: 03/08/2014 Elsevier Interactive Patient Education  2018 [employer]  

## 2017-11-21 ENCOUNTER — Encounter: Payer: Self-pay | Admitting: Obstetrics & Gynecology

## 2017-11-21 ENCOUNTER — Ambulatory Visit (INDEPENDENT_AMBULATORY_CARE_PROVIDER_SITE_OTHER): Payer: Managed Care, Other (non HMO) | Admitting: Obstetrics & Gynecology

## 2017-11-21 VITALS — BP 130/90 | HR 61 | Ht 67.0 in | Wt 228.5 lb

## 2017-11-21 DIAGNOSIS — D509 Iron deficiency anemia, unspecified: Secondary | ICD-10-CM

## 2017-11-21 DIAGNOSIS — N920 Excessive and frequent menstruation with regular cycle: Secondary | ICD-10-CM | POA: Diagnosis not present

## 2017-11-21 DIAGNOSIS — R109 Unspecified abdominal pain: Secondary | ICD-10-CM

## 2017-11-21 DIAGNOSIS — D219 Benign neoplasm of connective and other soft tissue, unspecified: Secondary | ICD-10-CM

## 2017-11-21 MED ORDER — MEGESTROL ACETATE 40 MG PO TABS: ORAL_TABLET | ORAL | 3 refills | Status: DC
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
## 2017-12-10 NOTE — Patient Instructions (Signed)
Patricia Barker  12/10/2017     @PREFPERIOPPHARMACY @   Your procedure is scheduled on  12/17/2017 .  Report to [name] at  645   A.M.  Call this number if you have problems the morning of surgery:  [phone]   Remember:  Do not eat food or drink liquids after midnight.  Take these medicines the morning of surgery with A SIP OF WATER  Lisinopril, cozaar, metoprolol. Use your inhaler before you come.   Do not wear jewelry, make-up or nail polish.  Do not wear lotions, powders, or perfumes, or deodorant.  Do not shave 48 hours prior to surgery.  Men may shave face and neck.  Do not bring valuables to the hospital.  [facility] is not responsible for any belongings or valuables.  Contacts, dentures or bridgework may not be worn into surgery.  Leave your suitcase in the car.  After surgery it may be brought to your room.  For patients admitted to the hospital, discharge time will be determined by your treatment team.  Patients discharged the day of surgery will not be allowed to drive home.   Name and phone number of your driver:   family Special instructions:  None  Please read over the following fact sheets that you were given. Pain Booklet, Coughing and Deep Breathing, Blood Transfusion Information, Lab Information, MRSA Information, Surgical Site Infection Prevention, Anesthesia Post-op Instructions and Care and Recovery After Surgery       Bilateral Salpingo-Oophorectomy Bilateral salpingo-oophorectomy is the surgical removal of both fallopian tubes and both ovaries. The ovaries are reproductive organs that produce eggs in women. The fallopian tubes allow eggs to move from the ovaries to the uterus. You may need this procedure if you:  Have had your uterus removed. This procedure is usually done after the uterus is removed.  Have cancer of the fallopian tubes or ovaries.  Have a high risk of cancer of the fallopian tubes or  ovaries.  There are three different techniques that can be used for this procedure:  Open. One large incision will be made in your abdomen.  Laparoscopic. A thin, lighted tube with a small camera on the end (laparoscope) will be used to help perform the procedure. The laparoscope will allow your surgeon to make several small incisions in the abdomen instead of one large incision.  Robot-assisted. A computer will be used to control surgical instruments that are attached to robotic arms. A laparoscope may also be used with this technique.  As a result of this procedure, you will become sterile (unable to become pregnant), and you will go into menopause (no longer able to have menstrual periods). You may develop symptoms of menopause such as hot flashes, night sweats, and mood changes. Your sex drive may also be affected. Tell a health care provider about:  Any allergies you have.  All medicines you are taking, including vitamins, herbs, eye drops, creams, and over-the-counter medicines.  Any problems you or family members have had with anesthetic medicines.  Any blood disorders you have.  Any surgeries you have had.  Any medical conditions you have.  Whether you are pregnant or may be pregnant. What are the risks? Generally, this is a safe procedure. However, problems may occur, including:  Infection.  Bleeding.  Allergic reactions to medicines.  Damage to other structures or organs.  Blood clots in the legs or lungs.  What  happens before the procedure? Staying hydrated Follow instructions from your health care provider about hydration, which may include:  Up to 2 hours before the procedure - you may continue to drink clear liquids, such as water, clear fruit juice, black coffee, and plain tea.  Eating and drinking restrictions Follow instructions from your health care provider about eating and drinking, which may include:  8 hours before the procedure - stop eating  heavy meals or foods such as meat, fried foods, or fatty foods.  6 hours before the procedure - stop eating light meals or foods, such as toast or cereal.  6 hours before the procedure - stop drinking milk or drinks that contain milk.  2 hours before the procedure - stop drinking clear liquids.  Medicines  Ask your health care provider about: ? Changing or stopping your regular medicines. This is especially important if you are taking diabetes medicines or blood thinners. ? Taking medicines such as aspirin and ibuprofen. These medicines can thin your blood. Do not take these medicines before your procedure if your health care provider instructs you not to.  You may be given antibiotic medicine to help prevent infection. General instructions  Do not smoke for at least 2 weeks before your procedure or as told by your health care provider.  You may have an exam or testing.  You may have a blood or urine sample taken.  Ask your health care provider how your surgical site will be marked or identified.  Plan to have someone take you home from the hospital.  If you will be going home right after the procedure, plan to have someone with you for 24 hours. What happens during the procedure?  To reduce your risk of infection: ? Your health care team will wash or sanitize their hands. ? Your skin will be washed with soap. ? Hair may be removed from the surgical area.  An IV tube will be inserted into one of your veins.  You will be given one or more of the following: ? A medicine to help you relax (sedative). ? A medicine to make you fall asleep (general anesthetic).  A thin tube (catheter) will be inserted through your urethra and into your bladder. The catheter drains urine during your procedure.  Depending on the type of surgery you are having, your surgeon will do one of the following: ? Make one incision in your abdomen (open surgery). ? Make two small incisions in your abdomen  (laparoscopic surgery). The laparoscope will be passed through one incision, and surgical instruments will be passed through the other. ? Make several small incisions in your abdomen (robot-assisted surgery). A laparoscope and other surgical instruments may be passed through the incisions.  Your fallopian tubes and ovaries will be cut away from the uterus and removed.  Your blood vessels will be clamped and tied to prevent too much bleeding.  The incision(s) in your abdomen will be closed with stitches (sutures) or staples.  A bandage (dressing) may be placed over your incision(s). The procedure may vary among health care providers and hospitals. What happens after the procedure?  Your blood pressure, heart rate, breathing rate, and blood oxygen level will be monitored until the medicines you were given have worn off.  You may continue to receive fluids and medicines through an IV tube.  You may continue to have a catheter draining your urine.  You may have to wear compression stockings. These stockings help to prevent blood clots and reduce  swelling in your legs.  You will be given pain medicine as needed.  Do not drive for 24 hours if you received a sedative. Summary  Bilateral salpingo-oophorectomy is a procedure to remove both fallopian tubes and both ovaries.  There are three different techniques that can be used for this procedure, including open, laparoscopic, and robotic. Talk with your health care provider about how your procedure will be done.  As a result of this procedure, you will become sterile and you will go into menopause.  Plan to have someone take you home from the hospital. This information is not intended to replace advice given to you by your health care provider. Make sure you discuss any questions you have with your health care provider. Document Released: 09/09/2005 Document Revised: 10/14/2016 Document Reviewed: 10/14/2016 Elsevier Interactive Patient  Education  2018 [place].  Bilateral Salpingo-Oophorectomy, Care After This sheet gives you information about how to care for yourself after your procedure. Your health care provider may also give you more specific instructions. If you have problems or questions, contact your health care provider. What can I expect after the procedure? After the procedure, it is common to have:  Abdominal pain.  Some occasional vaginal bleeding (spotting).  Tiredness.  Symptoms of menopause, such as hot flashes, night sweats, or mood swings.  Follow these instructions at home: Incision care  Keep your incision area and your bandage (dressing) clean and dry.  Follow instructions from your health care provider about how to take care of your incision. Make sure you: ? Wash your hands with soap and water before you change your dressing. If soap and water are not available, use hand sanitizer. ? Change your dressing as told by your health care provider. ? Leave stitches (sutures), staples, skin glue, or adhesive strips in place. These skin closures may need to stay in place for 2 weeks or longer. If adhesive strip edges start to loosen and curl up, you may trim the loose edges. Do not remove adhesive strips completely unless your health care provider tells you to do that.  Check your incision area every day for signs of infection. Check for: ? Redness, swelling, or pain. ? Fluid or blood. ? Warmth. ? Pus or a bad smell. Activity  Do not drive or use heavy machinery while taking prescription pain medicine.  Do not drive for 24 hours if you received a medicine to help you relax (sedative) during your procedure.  Take frequent, short walks throughout the day. Rest when you get tired. Ask your health care provider what activities are safe for you.  Avoid activity that requires great effort. Also, avoid heavy lifting. Do not lift anything that is heavier than 10 lbs. (4.5 kg), or the limit that your  health care provider tells you, until he or she says that it is safe to do so.  Do not douche, use tampons, or have sex until your health care provider approves. General instructions  To prevent or treat constipation while you are taking prescription pain medicine, your health care provider may recommend that you: ? Drink enough fluid to keep your urine clear or pale yellow. ? Take over-the-counter or prescription medicines. ? Eat foods that are high in fiber, such as fresh fruits and vegetables, whole grains, and beans. ? Limit foods that are high in fat and processed sugars, such as fried and sweet foods.  Take over-the-counter and prescription medicines only as told by your health care provider.  Do not take  baths, swim, or use a hot tub until your health care provider approves. Ask your health care provider if you can take showers. You may only be allowed to take sponge baths for bathing.  Wear compression stockings as told by your health care provider. These stockings help to prevent blood clots and reduce swelling in your legs.  Keep all follow-up visits as told by your health care provider. This is important. Contact a health care provider if:  You have pain when you urinate.  You have pus or a bad smelling discharge coming from your vagina.  You have redness, swelling, or pain around your incision.  You have fluid or blood coming from your incision.  Your incision feels warm to the touch.  You have pus or a bad smell coming from your incision.  You have a fever.  Your incision starts to break open.  You have pain in the abdomen, and it gets worse or does not get better when you take medicine.  You develop a rash.  You develop nausea and vomiting.  You feel lightheaded. Get help right away if:  You develop pain in your chest or leg.  You become short of breath.  You faint.  You have increased bleeding from your vagina. Summary  After the procedure, it is  common to have pain, bleeding in the vagina, and symptoms of menopause.  Follow instructions from your health care provider about how to take care of your incision.  Follow instructions from your health care provider about activities and restrictions.  Check your incision every day for signs of infection and report any symptoms to your health care provider. This information is not intended to replace advice given to you by your health care provider. Make sure you discuss any questions you have with your health care provider. Document Released: 09/09/2005 Document Revised: 10/14/2016 Document Reviewed: 10/14/2016 Elsevier Interactive Patient Education  2018 [place] Hysterectomy A supracervical hysterectomy is surgery to remove the top part of the uterus, but not the cervix. You will no longer have menstrual periods or be able to get pregnant after this surgery. The fallopian tubes and ovaries may also be removed (bilateral salpingo-oophorectomy) during this surgery. This surgery is usually performed using a minimally invasive technique called laparoscopy. This technique allows the surgery to be done through small incisions. The minimally invasive technique provides benefits such as less pain, less risk of infection, and shorter recovery time. Tell a health care provider about:  Any allergies you have.  All medicines you are taking, including vitamins, herbs, eye drops, creams, and over-the-counter medicines.  Any problems you or family members have had with anesthetic medicines.  Any blood disorders you have.  Any surgeries you have had.  Any medical conditions you have. What are the risks? Generally, this is a safe procedure. However, as with any procedure, complications can occur. Possible complications include:  Bleeding.  Blood clots in the legs or lung.  Infection.  Injury to surrounding organs.  Problems related to anesthesia.  Conversion to an open  abdominal surgery.  Additional surgery later to remove the cervix if you have problems with the cervix.  What happens before the procedure?  Ask your health care provider about changing or stopping your regular medicines.  Do not take aspirin or blood thinners (anticoagulants) for 1 week before the surgery, or as directed by your health care provider.  Do not eat or drink anything for 8 hours before the surgery, or as directed  by your health care provider.  Quit smoking if you smoke.  Arrange for a ride home after surgery and for someone to help you at home during recovery. What happens during the procedure?  You will be given an antibiotic medicine.  An IV tube will be placed in one of your veins. You will be given medicine to make you sleep (general anesthetic).  A gas (carbon dioxide) will be used to inflate your abdomen. This will allow your surgeon to look inside your abdomen, perform your surgery, and treat any other problems found if necessary.  Three or four small incisions will be made in your abdomen. One of these incisions will be made in the area of your belly button (navel). A thin, flexible tube with a tiny camera and light on the end of it (laparoscope) will be inserted into the incision. The camera on the laparoscope sends a picture to a TV screen in the operating room. This gives your surgeon a good view inside the abdomen.  Other surgical instruments will be inserted through the other incisions.  The uterus will be cut into small pieces and removed through the small incisions.  Your incisions will be closed. What happens after the procedure?  You will be taken to a recovery area where your progress will be monitored until you are awake, stable, and taking fluids well. If there are no other problems, you will then be moved to a regular hospital room, or you will be allowed to go home.  You will likely have minimal discomfort after the surgery because the incisions  are so small with the laparoscopic technique.  You will be given pain medicine while you are in the hospital and for when you go home.  If a bilateral salpingo-oophorectomy was performed before menopause, you will go through a sudden (abrupt) menopause. This can be helped with hormone medicines. This information is not intended to replace advice given to you by your health care provider. Make sure you discuss any questions you have with your health care provider. Document Released: 02/26/2008 Document Revised: 02/15/2016 Document Reviewed: 03/12/2013 Elsevier Interactive Patient Education  2018 [place] Hysterectomy, Care After Refer to this sheet in the next few weeks. These instructions provide you with information on caring for yourself after your procedure. Your health care provider may also give you more specific instructions. Your treatment has been planned according to current medical practices, but problems sometimes occur. Call your health care provider if you have any problems or questions after your procedure. What can I expect after the procedure? After your procedure, it is typical to have some discomfort, tenderness, swelling, and bruising at the surgical sites. This normally lasts for about 2 weeks. Follow these instructions at home:  Get plenty of rest and sleep.  Only take over-the-counter or prescription medicines as directed by your health care provider.  Do not take aspirin. It can cause bleeding.  Do not drive until your health care provider approves.  Follow your health care provider's advice regarding exercise, lifting, and general activities.  Resume your usual diet as directed by your health care provider.  Do not douche, use tampons, or have sexual intercourse for at least 6 weeks or until your health care provider gives you permission.  Change your bandages (dressings) only as directed by your health care provider.  Monitor your  temperature.  Take showers instead of baths for 2-3 weeks or as directed by your health care provider.  Drink enough fluids to  keep your urine clear or pale yellow.  Do not drink alcohol until your health care provider gives you permission.  If you are constipated, you may take a mild laxative if your health care provider approves. Bran foods may also help with constipation problems.  Try to have someone home with you for 1-2 weeks to help with activities.  Follow up with your health care provider as directed. Contact a health care provider if:  You have swelling, redness, or increasing pain in the incision area.  You have pus coming from an incision.  You notice a bad smell coming from the incision or dressing.  You have swelling, redness, or pain in the area around the IV site.  Your incision breaks open.  You feel dizzy or lightheaded.  You have pain or bleeding when you urinate.  You have persistent diarrhea.  You have persistent nausea and vomiting.  You have abnormal vaginal discharge.  You have a rash.  Your pain is not controlled with your prescribed medicine. Get help right away if:  You have a fever.  You have severe abdominal pain.  You have chest pain.  You have shortness of breath.  You faint.  You have pain, swelling, or redness in your leg.  You have heavy vaginal bleeding with blood clots. This information is not intended to replace advice given to you by your health care provider. Make sure you discuss any questions you have with your health care provider. Document Released: 06/30/2013 Document Revised: 02/15/2016 Document Reviewed: 03/12/2013 Elsevier Interactive Patient Education  2018 [place] Anesthesia, Adult General anesthesia is the use of medicines to make a person "go to sleep" (be unconscious) for a medical procedure. General anesthesia is often recommended when a procedure:  Is long.  Requires you to be still or  in an unusual position.  Is major and can cause you to lose blood.  Is impossible to do without general anesthesia.  The medicines used for general anesthesia are called general anesthetics. In addition to making you sleep, the medicines:  Prevent pain.  Control your blood pressure.  Relax your muscles.  Tell a health care provider about:  Any allergies you have.  All medicines you are taking, including vitamins, herbs, eye drops, creams, and over-the-counter medicines.  Any problems you or family members have had with anesthetic medicines.  Types of anesthetics you have had in the past.  Any bleeding disorders you have.  Any surgeries you have had.  Any medical conditions you have.  Any history of heart or lung conditions, such as heart failure, sleep apnea, or chronic obstructive pulmonary disease (COPD).  Whether you are pregnant or may be pregnant.  Whether you use tobacco, alcohol, marijuana, or street drugs.  Any history of Armed forces logistics/support/administrative officer.  Any history of depression or anxiety. What are the risks? Generally, this is a safe procedure. However, problems may occur, including:  Allergic reaction to anesthetics.  Lung and heart problems.  Inhaling food or liquids from your stomach into your lungs (aspiration).  Injury to nerves.  Waking up during your procedure and being unable to move (rare).  Extreme agitation or a state of mental confusion (delirium) when you wake up from the anesthetic.  Air in the bloodstream, which can lead to stroke.  These problems are more likely to develop if you are having a major surgery or if you have an advanced medical condition. You can prevent some of these complications by answering all of your  health care provider's questions thoroughly and by following all pre-procedure instructions. General anesthesia can cause side effects, including:  Nausea or vomiting  A sore throat from the breathing tube.  Feeling cold or  shivery.  Feeling tired, washed out, or achy.  Sleepiness or drowsiness.  Confusion or agitation.  What happens before the procedure? Staying hydrated Follow instructions from your health care provider about hydration, which may include:  Up to 2 hours before the procedure - you may continue to drink clear liquids, such as water, clear fruit juice, black coffee, and plain tea.  Eating and drinking restrictions Follow instructions from your health care provider about eating and drinking, which may include:  8 hours before the procedure - stop eating heavy meals or foods such as meat, fried foods, or fatty foods.  6 hours before the procedure - stop eating light meals or foods, such as toast or cereal.  6 hours before the procedure - stop drinking milk or drinks that contain milk.  2 hours before the procedure - stop drinking clear liquids.  Medicines  Ask your health care provider about: ? Changing or stopping your regular medicines. This is especially important if you are taking diabetes medicines or blood thinners. ? Taking medicines such as aspirin and ibuprofen. These medicines can thin your blood. Do not take these medicines before your procedure if your health care provider instructs you not to. ? Taking new dietary supplements or medicines. Do not take these during the week before your procedure unless your health care provider approves them.  If you are told to take a medicine or to continue taking a medicine on the day of the procedure, take the medicine with sips of water. General instructions   Ask if you will be going home the same day, the following day, or after a longer hospital stay. ? Plan to have someone take you home. ? Plan to have someone stay with you for the first 24 hours after you leave the hospital or clinic.  For 3-6 weeks before the procedure, try not to use any tobacco products, such as cigarettes, chewing tobacco, and e-cigarettes.  You may brush  your teeth on the morning of the procedure, but make sure to spit out the toothpaste. What happens during the procedure?  You will be given anesthetics through a mask and through an IV tube in one of your veins.  You may receive medicine to help you relax (sedative).  As soon as you are asleep, a breathing tube may be used to help you breathe.  An anesthesia specialist will stay with you throughout the procedure. He or she will help keep you comfortable and safe by continuing to give you medicines and adjusting the amount of medicine that you get. He or she will also watch your blood pressure, pulse, and oxygen levels to make sure that the anesthetics do not cause any problems.  If a breathing tube was used to help you breathe, it will be removed before you wake up. The procedure may vary among health care providers and hospitals. What happens after the procedure?  You will wake up, often slowly, after the procedure is complete, usually in a recovery area.  Your blood pressure, heart rate, breathing rate, and blood oxygen level will be monitored until the medicines you were given have worn off.  You may be given medicine to help you calm down if you feel anxious or agitated.  If you will be going home the same day,  your health care provider may check to make sure you can stand, drink, and urinate.  Your health care providers will treat your pain and side effects before you go home.  Do not drive for 24 hours if you received a sedative.  You may: ? Feel nauseous and vomit. ? Have a sore throat. ? Have mental slowness. ? Feel cold or shivery. ? Feel sleepy. ? Feel tired. ? Feel sore or achy, even in parts of your body where you did not have surgery. This information is not intended to replace advice given to you by your health care provider. Make sure you discuss any questions you have with your health care provider. Document Released: 12/17/2007 Document Revised: 02/20/2016  Document Reviewed: 08/24/2015 Elsevier Interactive Patient Education  2018 [place] Anesthesia, Adult, Care After These instructions provide you with information about caring for yourself after your procedure. Your health care provider may also give you more specific instructions. Your treatment has been planned according to current medical practices, but problems sometimes occur. Call your health care provider if you have any problems or questions after your procedure. What can I expect after the procedure? After the procedure, it is common to have:  Vomiting.  A sore throat.  Mental slowness.  It is common to feel:  Nauseous.  Cold or shivery.  Sleepy.  Tired.  Sore or achy, even in parts of your body where you did not have surgery.  Follow these instructions at home: For at least 24 hours after the procedure:  Do not: ? Participate in activities where you could fall or become injured. ? Drive. ? Use heavy machinery. ? Drink alcohol. ? Take sleeping pills or medicines that cause drowsiness. ? Make important decisions or sign legal documents. ? Take care of children on your own.  Rest. Eating and drinking  If you vomit, drink water, juice, or soup when you can drink without vomiting.  Drink enough fluid to keep your urine clear or pale yellow.  Make sure you have little or no nausea before eating solid foods.  Follow the diet recommended by your health care provider. General instructions  Have a responsible adult stay with you until you are awake and alert.  Return to your normal activities as told by your health care provider. Ask your health care provider what activities are safe for you.  Take over-the-counter and prescription medicines only as told by your health care provider.  If you smoke, do not smoke without supervision.  Keep all follow-up visits as told by your health care provider. This is important. Contact a health care provider  if:  You continue to have nausea or vomiting at home, and medicines are not helpful.  You cannot drink fluids or start eating again.  You cannot urinate after 8-12 hours.  You develop a skin rash.  You have fever.  You have increasing redness at the site of your procedure. Get help right away if:  You have difficulty breathing.  You have chest pain.  You have unexpected bleeding.  You feel that you are having a life-threatening or urgent problem. This information is not intended to replace advice given to you by your health care provider. Make sure you discuss any questions you have with your health care provider. Document Released: 12/16/2000 Document Revised: 02/12/2016 Document Reviewed: 08/24/2015 Elsevier Interactive Patient Education  [employer].

## 2017-12-11 ENCOUNTER — Other Ambulatory Visit (HOSPITAL_COMMUNITY): Payer: Self-pay | Admitting: *Deleted

## 2017-12-11 ENCOUNTER — Ambulatory Visit: Payer: Managed Care, Other (non HMO) | Admitting: Gastroenterology

## 2017-12-11 DIAGNOSIS — D509 Iron deficiency anemia, unspecified: Secondary | ICD-10-CM

## 2017-12-12 ENCOUNTER — Encounter (HOSPITAL_COMMUNITY): Payer: Self-pay

## 2017-12-12 ENCOUNTER — Ambulatory Visit (HOSPITAL_COMMUNITY): Payer: Managed Care, Other (non HMO) | Admitting: Internal Medicine

## 2017-12-12 ENCOUNTER — Other Ambulatory Visit: Payer: Self-pay

## 2017-12-12 ENCOUNTER — Other Ambulatory Visit (HOSPITAL_COMMUNITY): Payer: Managed Care, Other (non HMO)

## 2017-12-12 ENCOUNTER — Other Ambulatory Visit: Payer: Self-pay | Admitting: Obstetrics & Gynecology

## 2017-12-12 ENCOUNTER — Encounter (HOSPITAL_COMMUNITY)
Admission: RE | Admit: 2017-12-12 | Discharge: 2017-12-12 | Disposition: A | Discharge status: Home / Self Care | Payer: Managed Care, Other (non HMO) | Source: Ambulatory Visit | Attending: Obstetrics & Gynecology | Admitting: Obstetrics & Gynecology

## 2017-12-12 DIAGNOSIS — Z01818 Encounter for other preprocedural examination: Secondary | ICD-10-CM | POA: Diagnosis not present

## 2017-12-12 DIAGNOSIS — R9431 Abnormal electrocardiogram [ECG] [EKG]: Secondary | ICD-10-CM | POA: Insufficient documentation

## 2017-12-12 LAB — TYPE AND SCREEN
ABO/RH(D): B POS
ANTIBODY SCREEN: NEGATIVE

## 2017-12-12 LAB — URINALYSIS, ROUTINE W REFLEX MICROSCOPIC
Bilirubin Urine: NEGATIVE
GLUCOSE, UA: NEGATIVE mg/dL
Ketones, ur: NEGATIVE mg/dL
Leukocytes, UA: NEGATIVE
Nitrite: NEGATIVE
Protein, ur: NEGATIVE mg/dL
Specific Gravity, Urine: 1.021 (ref 1.005–1.030)
pH: 6 (ref 5.0–8.0)

## 2017-12-12 LAB — RAPID HIV SCREEN (HIV 1/2 AB+AG)
HIV 1/2 Antibodies: NONREACTIVE
HIV-1 P24 Antigen - HIV24: NONREACTIVE

## 2017-12-12 LAB — COMPREHENSIVE METABOLIC PANEL
ALK PHOS: 52 U/L (ref 38–126)
ALT: 14 U/L (ref 14–54)
AST: 17 U/L (ref 15–41)
Albumin: 3.7 g/dL (ref 3.5–5.0)
Anion gap: 10 (ref 5–15)
BUN: 12 mg/dL (ref 6–20)
CHLORIDE: 105 mmol/L (ref 101–111)
CO2: 23 mmol/L (ref 22–32)
CREATININE: 0.83 mg/dL (ref 0.44–1.00)
Calcium: 8.9 mg/dL (ref 8.9–10.3)
GFR calc Af Amer: >60 mL/min (ref >60)
Glucose, Bld: 154 mg/dL — ABNORMAL HIGH (ref 65–99)
Potassium: 3.4 mmol/L — ABNORMAL LOW (ref 3.5–5.1)
Sodium: 138 mmol/L (ref 135–145)
Total Bilirubin: 0.6 mg/dL (ref 0.3–1.2)
Total Protein: 7.4 g/dL (ref 6.5–8.1)

## 2017-12-12 LAB — CBC
HEMATOCRIT: 38.1 % (ref 36.0–46.0)
HEMOGLOBIN: 11.9 g/dL — AB (ref 12.0–15.0)
MCH: 23.8 pg — ABNORMAL LOW (ref 26.0–34.0)
MCHC: 31.2 g/dL (ref 30.0–36.0)
MCV: 76.4 fL — AB (ref 78.0–100.0)
PLATELETS: 176 10*3/uL (ref 150–400)
RBC: 4.99 MIL/uL (ref 3.87–5.11)
RDW: 16.7 % — ABNORMAL HIGH (ref 11.5–15.5)
WBC: 6.7 10*3/uL (ref 4.0–10.5)

## 2017-12-12 LAB — HCG, QUANTITATIVE, PREGNANCY

## 2017-12-17 ENCOUNTER — Encounter (HOSPITAL_COMMUNITY): Payer: Self-pay | Admitting: *Deleted

## 2017-12-17 ENCOUNTER — Encounter (HOSPITAL_COMMUNITY)
Admission: RE | Disposition: A | Discharge status: Home / Self Care | Payer: Self-pay | Source: Ambulatory Visit | Attending: Obstetrics & Gynecology

## 2017-12-17 ENCOUNTER — Inpatient Hospital Stay (HOSPITAL_COMMUNITY): Payer: Managed Care, Other (non HMO) | Admitting: Anesthesiology

## 2017-12-17 ENCOUNTER — Inpatient Hospital Stay (HOSPITAL_COMMUNITY)
Admission: RE | Admit: 2017-12-17 | Discharge: 2017-12-18 | DRG: 743 | Disposition: A | Discharge status: Home / Self Care | Payer: Managed Care, Other (non HMO) | Source: Ambulatory Visit | Attending: Obstetrics & Gynecology | Admitting: Obstetrics & Gynecology

## 2017-12-17 DIAGNOSIS — N920 Excessive and frequent menstruation with regular cycle: Secondary | ICD-10-CM | POA: Diagnosis present

## 2017-12-17 DIAGNOSIS — D500 Iron deficiency anemia secondary to blood loss (chronic): Secondary | ICD-10-CM | POA: Diagnosis present

## 2017-12-17 DIAGNOSIS — I10 Essential (primary) hypertension: Secondary | ICD-10-CM | POA: Diagnosis present

## 2017-12-17 DIAGNOSIS — Z888 Allergy status to other drugs, medicaments and biological substances status: Secondary | ICD-10-CM

## 2017-12-17 DIAGNOSIS — D219 Benign neoplasm of connective and other soft tissue, unspecified: Secondary | ICD-10-CM | POA: Diagnosis not present

## 2017-12-17 DIAGNOSIS — Z87891 Personal history of nicotine dependence: Secondary | ICD-10-CM

## 2017-12-17 DIAGNOSIS — D259 Leiomyoma of uterus, unspecified: Principal | ICD-10-CM | POA: Diagnosis present

## 2017-12-17 DIAGNOSIS — N921 Excessive and frequent menstruation with irregular cycle: Secondary | ICD-10-CM | POA: Diagnosis not present

## 2017-12-17 DIAGNOSIS — N852 Hypertrophy of uterus: Secondary | ICD-10-CM | POA: Diagnosis not present

## 2017-12-17 DIAGNOSIS — D509 Iron deficiency anemia, unspecified: Secondary | ICD-10-CM | POA: Diagnosis not present

## 2017-12-17 DIAGNOSIS — Z90710 Acquired absence of both cervix and uterus: Secondary | ICD-10-CM | POA: Diagnosis present

## 2017-12-17 DIAGNOSIS — Z029 Encounter for administrative examinations, unspecified: Secondary | ICD-10-CM

## 2017-12-17 HISTORY — PX: SUPRACERVICAL ABDOMINAL HYSTERECTOMY: SHX5393

## 2017-12-17 HISTORY — PX: SALPINGOOPHORECTOMY: SHX82

## 2017-12-17 SURGERY — HYSTERECTOMY, SUPRACERVICAL, ABDOMINAL
Anesthesia: General | Site: Abdomen

## 2017-12-17 MED ORDER — ROCURONIUM BROMIDE 50 MG/5ML IV SOLN
INTRAVENOUS | Status: AC
  Filled 2017-12-17: qty 1

## 2017-12-17 MED ORDER — DEXAMETHASONE SODIUM PHOSPHATE 4 MG/ML IJ SOLN
4.0000 mg | Freq: Once | INTRAMUSCULAR | Status: AC
  Administered 2017-12-17: 4 mg via INTRAVENOUS

## 2017-12-17 MED ORDER — ONDANSETRON HCL 4 MG PO TABS: 8.0000 mg | ORAL_TABLET | Freq: Four times a day (QID) | ORAL | Status: DC | PRN

## 2017-12-17 MED ORDER — KETOROLAC TROMETHAMINE 30 MG/ML IJ SOLN
30.0000 mg | Freq: Once | INTRAMUSCULAR | Status: AC
  Administered 2017-12-17: 30 mg via INTRAVENOUS
  Filled 2017-12-17: qty 1

## 2017-12-17 MED ORDER — MIDAZOLAM HCL 2 MG/2ML IJ SOLN
INTRAMUSCULAR | Status: AC
  Filled 2017-12-17: qty 2

## 2017-12-17 MED ORDER — SUGAMMADEX SODIUM 500 MG/5ML IV SOLN
INTRAVENOUS | Status: AC
  Filled 2017-12-17: qty 5

## 2017-12-17 MED ORDER — ONDANSETRON HCL 4 MG/2ML IJ SOLN
INTRAMUSCULAR | Status: AC
  Filled 2017-12-17: qty 2

## 2017-12-17 MED ORDER — FENTANYL CITRATE (PF) 100 MCG/2ML IJ SOLN
INTRAMUSCULAR | Status: DC | PRN
  Administered 2017-12-17: 100 ug via INTRAVENOUS
  Administered 2017-12-17 (×2): 50 ug via INTRAVENOUS

## 2017-12-17 MED ORDER — LISINOPRIL-HYDROCHLOROTHIAZIDE 20-25 MG PO TABS: 1.0000 | ORAL_TABLET | Freq: Every day | ORAL | Status: DC

## 2017-12-17 MED ORDER — KETOROLAC TROMETHAMINE 30 MG/ML IJ SOLN
30.0000 mg | Freq: Four times a day (QID) | INTRAMUSCULAR | Status: AC
  Administered 2017-12-17 – 2017-12-18 (×3): 30 mg via INTRAVENOUS
  Filled 2017-12-17 (×3): qty 1

## 2017-12-17 MED ORDER — EPHEDRINE SULFATE 50 MG/ML IJ SOLN
INTRAMUSCULAR | Status: AC
  Filled 2017-12-17: qty 1

## 2017-12-17 MED ORDER — ROCURONIUM BROMIDE 100 MG/10ML IV SOLN
INTRAVENOUS | Status: DC | PRN
  Administered 2017-12-17: 50 mg via INTRAVENOUS

## 2017-12-17 MED ORDER — LIDOCAINE HCL 1 % IJ SOLN
INTRAMUSCULAR | Status: DC | PRN
  Administered 2017-12-17: 25 mg via INTRADERMAL

## 2017-12-17 MED ORDER — CEFAZOLIN SODIUM-DEXTROSE 2-4 GM/100ML-% IV SOLN
2.0000 g | INTRAVENOUS | Status: AC
  Administered 2017-12-17: 2 g via INTRAVENOUS
  Filled 2017-12-17: qty 100

## 2017-12-17 MED ORDER — BUPIVACAINE LIPOSOME 1.3 % IJ SUSP
20.0000 mL | Freq: Once | INTRAMUSCULAR | Status: DC
  Filled 2017-12-17: qty 20

## 2017-12-17 MED ORDER — MIDAZOLAM HCL 5 MG/5ML IJ SOLN
INTRAMUSCULAR | Status: DC | PRN
  Administered 2017-12-17: 2 mg via INTRAVENOUS

## 2017-12-17 MED ORDER — ZOLPIDEM TARTRATE 5 MG PO TABS: 5.0000 mg | ORAL_TABLET | Freq: Every evening | ORAL | Status: DC | PRN

## 2017-12-17 MED ORDER — PROMETHAZINE HCL 25 MG/ML IJ SOLN: 25.0000 mg | Freq: Four times a day (QID) | INTRAMUSCULAR | Status: DC | PRN

## 2017-12-17 MED ORDER — ALBUTEROL SULFATE (2.5 MG/3ML) 0.083% IN NEBU: 2.5000 mg | INHALATION_SOLUTION | RESPIRATORY_TRACT | Status: DC | PRN

## 2017-12-17 MED ORDER — FENTANYL CITRATE (PF) 250 MCG/5ML IJ SOLN
INTRAMUSCULAR | Status: AC
  Filled 2017-12-17: qty 5

## 2017-12-17 MED ORDER — LACTATED RINGERS IV SOLN
INTRAVENOUS | Status: DC
  Administered 2017-12-17 (×3): via INTRAVENOUS

## 2017-12-17 MED ORDER — HYDROCHLOROTHIAZIDE 25 MG PO TABS: 25.0000 mg | ORAL_TABLET | Freq: Every day | ORAL | Status: DC

## 2017-12-17 MED ORDER — HYDROMORPHONE HCL 1 MG/ML IJ SOLN
0.2500 mg | INTRAMUSCULAR | Status: DC | PRN
  Administered 2017-12-17 (×3): 0.5 mg via INTRAVENOUS
  Filled 2017-12-17 (×3): qty 0.5

## 2017-12-17 MED ORDER — ALUM & MAG HYDROXIDE-SIMETH 200-200-20 MG/5ML PO SUSP: 30.0000 mL | ORAL | Status: DC | PRN

## 2017-12-17 MED ORDER — PROPOFOL 10 MG/ML IV BOLUS
INTRAVENOUS | Status: DC | PRN
  Administered 2017-12-17: 160 mg via INTRAVENOUS
  Administered 2017-12-17: 20 mg via INTRAVENOUS

## 2017-12-17 MED ORDER — SODIUM CHLORIDE 0.9 % IV SOLN
INTRAVENOUS | Status: DC | PRN
  Administered 2017-12-17: 40 mL

## 2017-12-17 MED ORDER — KCL IN DEXTROSE-NACL 40-5-0.45 MEQ/L-%-% IV SOLN
INTRAVENOUS | Status: DC
  Administered 2017-12-17 – 2017-12-18 (×2): via INTRAVENOUS
  Filled 2017-12-17: qty 1000

## 2017-12-17 MED ORDER — 0.9 % SODIUM CHLORIDE (POUR BTL) OPTIME
TOPICAL | Status: DC | PRN
  Administered 2017-12-17: 2000 mL

## 2017-12-17 MED ORDER — METOPROLOL TARTRATE 50 MG PO TABS
50.0000 mg | ORAL_TABLET | Freq: Every day | ORAL | Status: DC
  Administered 2017-12-18: 50 mg via ORAL
  Filled 2017-12-17 (×2): qty 1

## 2017-12-17 MED ORDER — MIDAZOLAM HCL 2 MG/2ML IJ SOLN
1.0000 mg | INTRAMUSCULAR | Status: DC
  Administered 2017-12-17: 2 mg via INTRAVENOUS

## 2017-12-17 MED ORDER — LIDOCAINE HCL (PF) 1 % IJ SOLN
INTRAMUSCULAR | Status: AC
  Filled 2017-12-17: qty 5

## 2017-12-17 MED ORDER — SENNOSIDES-DOCUSATE SODIUM 8.6-50 MG PO TABS: 1.0000 | ORAL_TABLET | Freq: Every evening | ORAL | Status: DC | PRN

## 2017-12-17 MED ORDER — SODIUM CHLORIDE 0.9 % IV SOLN
8.0000 mg | Freq: Four times a day (QID) | INTRAVENOUS | Status: DC | PRN
  Filled 2017-12-17: qty 4

## 2017-12-17 MED ORDER — LOSARTAN POTASSIUM 50 MG PO TABS
100.0000 mg | ORAL_TABLET | Freq: Every day | ORAL | Status: DC
  Administered 2017-12-18: 100 mg via ORAL
  Filled 2017-12-17 (×2): qty 2

## 2017-12-17 MED ORDER — SODIUM CHLORIDE 0.9 % IJ SOLN
INTRAMUSCULAR | Status: AC
  Filled 2017-12-17: qty 10

## 2017-12-17 MED ORDER — HYDROCHLOROTHIAZIDE 25 MG PO TABS
25.0000 mg | ORAL_TABLET | Freq: Every day | ORAL | Status: DC
  Administered 2017-12-18: 25 mg via ORAL
  Filled 2017-12-17 (×2): qty 1

## 2017-12-17 MED ORDER — SUGAMMADEX SODIUM 500 MG/5ML IV SOLN
INTRAVENOUS | Status: DC | PRN
  Administered 2017-12-17: 200 mg via INTRAVENOUS

## 2017-12-17 MED ORDER — OXYCODONE-ACETAMINOPHEN 7.5-325 MG PO TABS
1.0000 | ORAL_TABLET | ORAL | Status: DC | PRN
Start: 2017-12-17 — End: 2017-12-18
  Administered 2017-12-17 (×2): 2 via ORAL
  Filled 2017-12-17 (×2): qty 2

## 2017-12-17 MED ORDER — SUCCINYLCHOLINE CHLORIDE 20 MG/ML IJ SOLN
INTRAMUSCULAR | Status: AC
  Filled 2017-12-17: qty 1

## 2017-12-17 MED ORDER — ONDANSETRON HCL 4 MG/2ML IJ SOLN
4.0000 mg | Freq: Once | INTRAMUSCULAR | Status: AC
  Administered 2017-12-17: 4 mg via INTRAVENOUS

## 2017-12-17 MED ORDER — DOCUSATE SODIUM 100 MG PO CAPS
100.0000 mg | ORAL_CAPSULE | Freq: Two times a day (BID) | ORAL | Status: DC
  Administered 2017-12-17 – 2017-12-18 (×2): 100 mg via ORAL
  Filled 2017-12-17 (×3): qty 1

## 2017-12-17 MED ORDER — LISINOPRIL 10 MG PO TABS: 20.0000 mg | ORAL_TABLET | Freq: Every day | ORAL | Status: DC

## 2017-12-17 MED ORDER — HYDROMORPHONE HCL 1 MG/ML IJ SOLN
1.0000 mg | INTRAMUSCULAR | Status: DC | PRN
  Administered 2017-12-17: 1 mg via INTRAVENOUS
  Filled 2017-12-17: qty 1

## 2017-12-17 MED ORDER — PROPOFOL 10 MG/ML IV BOLUS
INTRAVENOUS | Status: AC
  Filled 2017-12-17: qty 40

## 2017-12-17 MED ORDER — BISACODYL 10 MG RE SUPP: 10.0000 mg | Freq: Every day | RECTAL | Status: DC | PRN

## 2017-12-17 MED ORDER — HEMOSTATIC AGENTS (NO CHARGE) OPTIME
TOPICAL | Status: DC | PRN
  Administered 2017-12-17: 1 via TOPICAL

## 2017-12-17 MED ORDER — ALBUTEROL SULFATE HFA 108 (90 BASE) MCG/ACT IN AERS: 2.0000 | INHALATION_SPRAY | RESPIRATORY_TRACT | Status: DC | PRN

## 2017-12-17 MED ORDER — SODIUM CHLORIDE 0.9 % IJ SOLN
INTRAMUSCULAR | Status: AC
  Filled 2017-12-17: qty 20

## 2017-12-17 MED ORDER — BUPIVACAINE LIPOSOME 1.3 % IJ SUSP
INTRAMUSCULAR | Status: AC
  Filled 2017-12-17: qty 20

## 2017-12-17 MED ORDER — DEXAMETHASONE SODIUM PHOSPHATE 4 MG/ML IJ SOLN
INTRAMUSCULAR | Status: AC
  Filled 2017-12-17: qty 1

## 2017-12-17 SURGICAL SUPPLY — 50 items
APPLIER CLIP 13 LRG OPEN (CLIP)
BAG HAMPER (MISCELLANEOUS) ×3 IMPLANT
BLADE SURG SZ10 CARB STEEL (BLADE) ×3 IMPLANT
CLIP APPLIE 13 LRG OPEN (CLIP) IMPLANT
CLOTH BEACON ORANGE TIMEOUT ST (SAFETY) ×3 IMPLANT
COVER LIGHT HANDLE STERIS (MISCELLANEOUS) ×6 IMPLANT
DERMABOND ADVANCED (GAUZE/BANDAGES/DRESSINGS) ×1
DERMABOND ADVANCED .7 DNX12 (GAUZE/BANDAGES/DRESSINGS) ×2 IMPLANT
DRAPE WARM FLUID 44X44 (DRAPE) ×3 IMPLANT
DRESSING MEPILEX BORDER 6X8 (GAUZE/BANDAGES/DRESSINGS) ×2 IMPLANT
DRSG MEPILEX BORDER 6X8 (GAUZE/BANDAGES/DRESSINGS) ×3
DRSG OPSITE POSTOP 4X8 (GAUZE/BANDAGES/DRESSINGS) ×3 IMPLANT
ELECT REM PT RETURN 9FT ADLT (ELECTROSURGICAL) ×3
ELECTRODE REM PT RTRN 9FT ADLT (ELECTROSURGICAL) ×2 IMPLANT
GAUZE SPONGE 4X4 12PLY STRL (GAUZE/BANDAGES/DRESSINGS) ×3 IMPLANT
GLOVE BIOGEL PI IND STRL 6.5 (GLOVE) ×2 IMPLANT
GLOVE BIOGEL PI IND STRL 7.0 (GLOVE) ×8 IMPLANT
GLOVE BIOGEL PI IND STRL 8 (GLOVE) ×2 IMPLANT
GLOVE BIOGEL PI INDICATOR 6.5 (GLOVE) ×1
GLOVE BIOGEL PI INDICATOR 7.0 (GLOVE) ×4
GLOVE BIOGEL PI INDICATOR 8 (GLOVE) ×1
GLOVE ECLIPSE 7.0 STRL STRAW (GLOVE) ×3 IMPLANT
GLOVE ECLIPSE 8.0 STRL XLNG CF (GLOVE) ×3 IMPLANT
GLOVE SURG SS PI 6.5 STRL IVOR (GLOVE) ×3 IMPLANT
GOWN STRL REUS W/ TWL LRG LVL3 (GOWN DISPOSABLE) ×4 IMPLANT
GOWN STRL REUS W/TWL LRG LVL3 (GOWN DISPOSABLE) ×8 IMPLANT
GOWN STRL REUS W/TWL XL LVL3 (GOWN DISPOSABLE) ×3 IMPLANT
HEMOSTAT ARISTA ABSORB 3G PWDR (MISCELLANEOUS) ×6 IMPLANT
INST SET MAJOR GENERAL (KITS) ×3 IMPLANT
KIT TURNOVER KIT A (KITS) ×3 IMPLANT
MANIFOLD NEPTUNE II (INSTRUMENTS) ×3 IMPLANT
NEEDLE HYPO 21X1.5 SAFETY (NEEDLE) ×3 IMPLANT
NS IRRIG 1000ML POUR BTL (IV SOLUTION) ×6 IMPLANT
PACK ABDOMINAL MAJOR (CUSTOM PROCEDURE TRAY) ×3 IMPLANT
PAD ARMBOARD 7.5X6 YLW CONV (MISCELLANEOUS) ×3 IMPLANT
SET BASIN LINEN APH (SET/KITS/TRAYS/PACK) ×3 IMPLANT
SPONGE LAP 18X18 X RAY DECT (DISPOSABLE) ×3 IMPLANT
SUT CHROMIC 0 CT 1 (SUTURE) ×3 IMPLANT
SUT MNCRL+ AB 3-0 CT1 36 (SUTURE) IMPLANT
SUT MON AB 3-0 SH 27 (SUTURE) IMPLANT
SUT MONOCRYL AB 3-0 CT1 36IN (SUTURE)
SUT PLAIN 2 0 XLH (SUTURE) IMPLANT
SUT VIC AB 0 CT1 27 (SUTURE) ×3
SUT VIC AB 0 CT1 27XBRD ANTBC (SUTURE) ×2 IMPLANT
SUT VIC AB 0 CT1 27XCR 8 STRN (SUTURE) ×4 IMPLANT
SUT VIC AB 0 CTX 36 (SUTURE) ×1
SUT VIC AB 0 CTX36XBRD ANTBCTR (SUTURE) ×2 IMPLANT
SUT VICRYL 3 0 (SUTURE) ×3 IMPLANT
SYR 20CC LL (SYRINGE) ×3 IMPLANT
TRAY FOLEY W/METER SILVER 16FR (SET/KITS/TRAYS/PACK) ×3 IMPLANT
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
## 2017-12-17 NOTE — Op Note (Signed)
Preoperative diagnosis:  1.  933 cc size uterus, 16 weeks size                                          2.  menometrorrhagia                                         3.  Anemia requiring iron infusions                                           Postoperative diagnosis:  Same as above   Procedure:  Abdominal hysterectomy, supracervical, with removal of both tubes and ovaries  Surgeon:  Florian Buff  Assistant:    Anesthesia:  General endotracheal  Preoperative clinical summary:   Patricia Barker a 75 y.W.[id number] with Patient's last menstrual period was 11/17/2017.admitted for a abdominal supracervical hysterectomy with removal of both tubes and ovaries. Uterus is 933 cc, 16 weeks size on exam  Long history of iron deficiency anemia, s/p iron infusion, never had a blood transfusion  Desires ovary removal and cervical preservation, Pap 12/2016 was reviewed and is normal    Intraoperative findings: multiple fibroids, ovaries normal  Description of operation:  Patient was taken to the operating room and placed in the supine position where she underwent general endotracheal anesthesia.  She was then prepped and draped in the usual sterile fashion and a Foley catheter was placed for continuous bladder drainage.  A Pfannenstiel skin incision was made and carried down sharply to the rectus fascia which was scored in the midline and extended laterally.  The fascia was taken off the muscles superiorly and inferiorly without difficulty.  The muscles were divided.  The peritoneal cavity was entered.  The upper abdomen was packed away. Both uterine cornu were grasped with Coker clamps.  The left round ligament was suture ligated and coagulated with the electrocautery unit.  The left vesicouterine serosal flap was created.  An avascular window in in the peritoneum was created and the left infundibulo pelvic ligament was cross clamped, cut and suture ligated.  The right round ligament was suture  ligated and cut with the electrocautery unit.  The vesicouterine serosal flap on the right was created.  An avascular window in the peritoneum was created and the right infundibulopelvic ligament was cross clamped, cut and double suture ligated.  Thus both ovaries and tubes were removed.  The uterine vessels were skeletonized bilaterally.  The uterine vessels were clamped bilaterally,  then cut and suture ligated.  Two more pedicles were taken down the cervix medial to the uterine vessels.  Each pedicle was clamped cut and suture ligated with good resulting hemostasis.  As per the preoperative plan the cervix was then transected sharply and the specimen was removed.  The cervical stump was then closed anterior to posterior for hemostasis and reduce postoperative adhesions.  The pelvis was irrigated vigorously and all pedicles were examined and found to be hemostatic. All specimens were sent to pathology.  arista was placed for peritoneal surface for routine evaluation.  Pelvis was irrigated.  All packs were removed and all counts were correct at this point x 3.  The muscles and  peritoneum were reapproximated loosely.  The fascia was closed with 0 Vicryl running. The skin was closed using 3-0 Vicryl on a Keith needle in a subcuticular fashion.  Dermabond was then applied for additional wound integrity and to serve as a postoperative bacterial barrier.  The patient was awakened from anesthesia taken to the recovery room in good stable condition. All sponge instrument and needle counts were correct x 3.  The patient received Ancef and Toradol prophylactically preoperatively.  Estimated blood loss for the procedure was 500  cc.  EURE,LUTHER H 12/17/2017 10:34 AM

## 2017-12-17 NOTE — Anesthesia Preprocedure Evaluation (Signed)
Anesthesia Evaluation  Patient identified by MRN, date of birth, ID band Patient awake    Reviewed: Allergy & Precautions, NPO status , Patient's Chart, lab work & pertinent test results  Airway Mallampati: II  TM Distance: >3 FB Neck ROM: Full    Dental  (+) Teeth Intact   Pulmonary former smoker,    breath sounds clear to auscultation       Cardiovascular hypertension, Pt. on medications  Rhythm:Regular Rate:Normal     Neuro/Psych negative neurological ROS  negative psych ROS   GI/Hepatic negative GI ROS, Neg liver ROS,   Endo/Other  negative endocrine ROS  Renal/GU negative Renal ROS     Musculoskeletal   Abdominal   Peds  Hematology  (+) anemia ,   Anesthesia Other Findings   Reproductive/Obstetrics                             Anesthesia Physical Anesthesia Plan  ASA: II  Anesthesia Plan: General   Post-op Pain Management:    Induction: Intravenous  PONV Risk Score and Plan:   Airway Management Planned: Oral ETT  Additional Equipment:   Intra-op Plan:   Post-operative Plan: Extubation in OR  Informed Consent: I have reviewed the patients History and Physical, chart, labs and discussed the procedure including the risks, benefits and alternatives for the proposed anesthesia with the patient or authorized representative who has indicated his/her understanding and acceptance.     Plan Discussed with:   Anesthesia Plan Comments:         Anesthesia Quick Evaluation

## 2017-12-17 NOTE — Anesthesia Postprocedure Evaluation (Signed)
Anesthesia Post Note  Patient: Patricia Barker  Procedure(s) Performed: SUPRACERVICAL ABDOMINAL HYSTERECTOMY (N/A Abdomen) BILATERAL SALPINGO OOPHORECTOMY (Bilateral Abdomen)  Patient location during evaluation: PACU Anesthesia Type: General Level of consciousness: awake and patient cooperative Pain management: pain level controlled Vital Signs Assessment: post-procedure vital signs reviewed and stable Respiratory status: spontaneous breathing, nonlabored ventilation, respiratory function stable and patient connected to face mask oxygen Cardiovascular status: blood pressure returned to baseline Postop Assessment: no apparent nausea or vomiting Anesthetic complications: no     Last Vitals:  Vitals:   12/17/17 0720 12/17/17 1018  BP: 129/81   Pulse:    Resp: (!) 31   Temp:  36.7 C  SpO2: 91%     Last Pain:  Vitals:   12/17/17 1018  TempSrc:   PainSc: Asleep                 Nocholas Damaso J

## 2017-12-17 NOTE — Transfer of Care (Signed)
Immediate Anesthesia Transfer of Care Note  Patient: Patricia Barker  Procedure(s) Performed: SUPRACERVICAL ABDOMINAL HYSTERECTOMY (N/A Abdomen) BILATERAL SALPINGO OOPHORECTOMY (Bilateral Abdomen)  Patient Location: PACU  Anesthesia Type:General  Level of Consciousness: drowsy and patient cooperative  Airway & Oxygen Therapy: Patient Spontanous Breathing and Patient connected to face mask oxygen  Post-op Assessment: Report given to RN and Post -op Vital signs reviewed and stable  Post vital signs: Reviewed and stable  Last Vitals:  Vitals Value Taken Time  BP    Temp    Pulse    Resp    SpO2      Last Pain:  Vitals:   12/17/17 0704  TempSrc: Oral  PainSc: 0-No pain      Patients Stated Pain Goal: 5 (21/19/41 7408)  Complications: No apparent anesthesia complications

## 2017-12-17 NOTE — Interval H&P Note (Signed)
History and Physical Interval Note:  12/17/2017 7:56 AM  Patricia Barker  has presented today for surgery, with the diagnosis of menorrhagia fibroids  The various methods of treatment have been discussed with the patient and family. After consideration of risks, benefits and other options for treatment, the patient has consented to  Procedure(s): HYSTERECTOMY SUPRACERVICAL ABDOMINAL (N/A) SALPINGO OOPHORECTOMY (Bilateral) as a surgical intervention .  The patient's history has been reviewed, patient examined, no change in status, stable for surgery.  I have reviewed the patient's chart and labs.  Questions were answered to the patient's satisfaction.     Florian Buff

## 2017-12-17 NOTE — Anesthesia Procedure Notes (Signed)
Procedure Name: Intubation Date/Time: 12/17/2017 8:33 AM Performed by: Charmaine Downs, CRNA Pre-anesthesia Checklist: Patient identified, Patient being monitored, Timeout performed, Emergency Drugs available and Suction available Patient Re-evaluated:Patient Re-evaluated prior to induction Oxygen Delivery Method: Circle System Utilized Preoxygenation: Pre-oxygenation with 100% oxygen Induction Type: IV induction Ventilation: Mask ventilation without difficulty and Oral airway inserted - appropriate to patient size Laryngoscope Size: Mac and 4 Grade View: Grade II Tube type: Oral Tube size: 7.0 mm Number of attempts: 1 Airway Equipment and Method: stylet Placement Confirmation: ETT inserted through vocal cords under direct vision,  positive ETCO2 and breath sounds checked- equal and bilateral Secured at: 22 cm Tube secured with: Tape Dental Injury: Teeth and Oropharynx as per pre-operative assessment

## 2017-12-18 ENCOUNTER — Encounter (HOSPITAL_COMMUNITY): Payer: Self-pay | Admitting: Obstetrics & Gynecology

## 2017-12-18 ENCOUNTER — Other Ambulatory Visit: Payer: Self-pay

## 2017-12-18 LAB — BASIC METABOLIC PANEL WITH GFR
Anion gap: 10 (ref 5–15)
BUN: 13 mg/dL (ref 6–20)
CO2: 23 mmol/L (ref 22–32)
Calcium: 8.4 mg/dL — ABNORMAL LOW (ref 8.9–10.3)
Chloride: 105 mmol/L (ref 101–111)
Creatinine, Ser: 0.85 mg/dL (ref 0.44–1.00)
GFR calc Af Amer: >60 mL/min
GFR calc non Af Amer: >60 mL/min
Glucose, Bld: 121 mg/dL — ABNORMAL HIGH (ref 65–99)
Potassium: 3.9 mmol/L (ref 3.5–5.1)
Sodium: 138 mmol/L (ref 135–145)

## 2017-12-18 LAB — CBC
HEMATOCRIT: 31.6 % — AB (ref 36.0–46.0)
Hemoglobin: 9.8 g/dL — ABNORMAL LOW (ref 12.0–15.0)
MCH: 23.7 pg — ABNORMAL LOW (ref 26.0–34.0)
MCHC: 31 g/dL (ref 30.0–36.0)
MCV: 76.5 fL — ABNORMAL LOW (ref 78.0–100.0)
Platelets: 146 10*3/uL — ABNORMAL LOW (ref 150–400)
RBC: 4.13 MIL/uL (ref 3.87–5.11)
RDW: 16.7 % — ABNORMAL HIGH (ref 11.5–15.5)
WBC: 7.1 10*3/uL (ref 4.0–10.5)

## 2017-12-18 MED ORDER — ONDANSETRON HCL 8 MG PO TABS: 8.0000 mg | ORAL_TABLET | Freq: Four times a day (QID) | ORAL | 0 refills | Status: DC | PRN

## 2017-12-18 MED ORDER — KETOROLAC TROMETHAMINE 10 MG PO TABS: 10.0000 mg | ORAL_TABLET | Freq: Three times a day (TID) | ORAL | 0 refills | Status: DC | PRN

## 2017-12-18 MED ORDER — OXYCODONE-ACETAMINOPHEN 7.5-325 MG PO TABS: 1.0000 | ORAL_TABLET | ORAL | 0 refills | Status: DC | PRN

## 2017-12-18 NOTE — Anesthesia Postprocedure Evaluation (Signed)
Anesthesia Post Note  Patient: Patricia Barker  Procedure(s) Performed: SUPRACERVICAL ABDOMINAL HYSTERECTOMY (N/A Abdomen) BILATERAL SALPINGO OOPHORECTOMY (Bilateral Abdomen)  Patient location during evaluation: Nursing Unit Anesthesia Type: General Level of consciousness: awake and alert and patient cooperative Pain management: satisfactory to patient Vital Signs Assessment: post-procedure vital signs reviewed and stable Respiratory status: spontaneous breathing Cardiovascular status: stable Postop Assessment: no apparent nausea or vomiting Anesthetic complications: no     Last Vitals:  Vitals:   12/18/17 0430 12/18/17 0917  BP: 117/67 (!) 133/54  Pulse: 62 72  Resp: 16   Temp: 36.9 C   SpO2: 100%     Last Pain:  Vitals:   12/18/17 0711  TempSrc:   PainSc: 2                  Kimmie Doren

## 2017-12-18 NOTE — Addendum Note (Signed)
Addendum  created 12/18/17 1311 by Vista Deck, CRNA   Sign clinical note

## 2017-12-18 NOTE — Progress Notes (Signed)
Discharge instructions gone over with patient. Patient expressed full understanding of instructions, medications, restrictions and activity levels, and follow up appointment times, dates and locations. No complaints of pain, shortness of breath, chest pain, dizziness, nausea or vomiting. Patient dressed self. Patient discharged home with all belongings. Son present and driving patient home.

## 2017-12-18 NOTE — Discharge Summary (Signed)
Physician Discharge Summary  Patient ID: Patricia Barker MRN: [id number] DOB/AGE: 04/11/1971 47 y.o.  Admit date: 12/17/2017 Discharge date: 12/18/2017  Admission Diagnoses: 16-week size fibroid uterus Menometrorrhagia Anemia requiring iron transfusions  Discharge Diagnoses:  Active Problems:   S/P hysterectomy Supracervical with removal of both tubes and ovaries  Discharged Condition: Good stable  Hospital Course: Unremarkable postoperative course Please refer to the op note for intraoperative details  Consults: None  Significant Diagnostic Studies:   Treatments: surgery: Supracervical abdominal hysterectomy with removal of both tubes and ovaries  Discharge Exam: Blood pressure 118/64, pulse 62, temperature 98.8 F (37.1 C), temperature source Oral, resp. rate 16, height 5\' 7"  (1.702 m), weight 225 lb (102.1 kg), SpO2 100 %. General appearance: alert, cooperative and no distress GI: soft, non-tender; bowel sounds normal; no masses,  no organomegaly Incision/Wound:clean dry intact  Disposition: Discharge disposition: 01-Home or Self Care       Discharge Instructions    Call MD for:  persistant nausea and vomiting   Complete by:  As directed    Call MD for:  severe uncontrolled pain   Complete by:  As directed    Call MD for:  temperature >100.4   Complete by:  As directed    Diet - low sodium heart healthy   Complete by:  As directed    Driving Restrictions   Complete by:  As directed    No driving for 1 week   Increase activity slowly   Complete by:  As directed    Leave dressing on - Keep it clean, dry, and intact until clinic visit   Complete by:  As directed    Lifting restrictions   Complete by:  As directed    Do not lift more than 10 pounds   Sexual Activity Restrictions   Complete by:  As directed    No sex for 6 weeks     Allergies as of 12/18/2017      Reactions   Injectafer [ferric Carboxymaltose] Other (See Comments)   Abdominal pain and  back pain       Medication List    STOP taking these medications   albuterol 108 (90 Base) MCG/ACT inhaler Commonly known as:  PROVENTIL HFA;VENTOLIN HFA   ferrous gluconate 225 (27 Fe) MG tablet Commonly known as:  FERGON   megestrol 40 MG tablet Commonly known as:  MEGACE     TAKE these medications   cephALEXin 250 MG capsule Commonly known as:  KEFLEX Take 1 capsule by mouth 4 (four) times daily.   hydrochlorothiazide 25 MG tablet Commonly known as:  HYDRODIURIL Take 25 mg by mouth daily.   ketorolac 10 MG tablet Commonly known as:  TORADOL Take 1 tablet (10 mg total) by mouth every 8 (eight) hours as needed.   losartan 100 MG tablet Commonly known as:  COZAAR Take 100 mg by mouth daily.   metoprolol tartrate 50 MG tablet Commonly known as:  LOPRESSOR Take 50 mg by mouth daily.   ondansetron 8 MG tablet Commonly known as:  ZOFRAN Take 1 tablet (8 mg total) by mouth every 6 (six) hours as needed for nausea.   oxyCODONE-acetaminophen 7.5-325 MG tablet Commonly known as:  PERCOCET Take 1-2 tablets by mouth every 4 (four) hours as needed for moderate pain.      Follow-up Information    Florian Buff, MD Follow up on 12/25/2017.   Specialties:  Obstetrics and Gynecology, Radiology Contact information: [address]  [place] [address] [phone]           Signed: Florian Buff 12/18/2017, 2:14 PM

## 2017-12-18 NOTE — Discharge Instructions (Signed)
Abdominal Hysterectomy, Care After °This sheet gives you information about how to care for yourself after your procedure. Your health care provider may also give you more specific instructions. If you have problems or questions, contact your health care provider. °What can I expect after the procedure? °After your procedure, it is common to have: °· Pain. °· Fatigue. °· Poor appetite. °· Less interest in sex. °· Vaginal bleeding and discharge. You may need to use a sanitary napkin after this procedure. ° °Follow these instructions at home: °Bathing °· Do not take baths, swim, or use a hot tub until your health care provider approves. Ask your health care provider if you can take showers. You may only be allowed to take sponge baths for bathing. °· Keep the bandage (dressing) dry until your health care provider says it can be removed. °Incision care °· Follow instructions from your health care provider about how to take care of your incision. Make sure you: °? Wash your hands with soap and water before you change your bandage (dressing). If soap and water are not available, use hand sanitizer. °? Change your dressing as told by your health care provider. °? Leave stitches (sutures), skin glue, or adhesive strips in place. These skin closures may need to stay in place for 2 weeks or longer. If adhesive strip edges start to loosen and curl up, you may trim the loose edges. Do not remove adhesive strips completely unless your health care provider tells you to do that. °· Check your incision area every day for signs of infection. Check for: °? Redness, swelling, or pain. °? Fluid or blood. °? Warmth. °? Pus or a bad smell. °Activity °· Do gentle, daily exercises as told by your health care provider. You may be told to take short walks every day and go farther each time. °· Do not lift anything that is heavier than 10 lb (4.5 kg), or the limit that your health care provider tells you, until he or she says that it is  safe. °· Do not drive or use heavy machinery while taking prescription pain medicine. °· Do not drive for 24 hours if you were given a medicine to help you relax (sedative). °· Follow your health care provider's instructions about exercise, driving, and general activities. Ask your health care provider what activities are safe for you. °Lifestyle °· Do not douche, use tampons, or have sex for at least 6 weeks or as told by your health care provider. °· Do not drink alcohol until your health care provider approves. °· Drink enough fluid to keep your urine clear or pale yellow. °· Try to have someone at home with you for the first 1-2 weeks to help. °· Do not use any products that contain nicotine or tobacco, such as cigarettes and e-cigarettes. These can delay healing. If you need help quitting, ask your health care provider. °General instructions °· Take over-the-counter and prescription medicines only as told by your health care provider. °· Do not take aspirin or ibuprofen. These medicines can cause bleeding. °· To prevent or treat constipation while you are taking prescription pain medicine, your health care provider may recommend that you: °? Drink enough fluid to keep your urine clear or pale yellow. °? Take over-the-counter or prescription medicines. °? Eat foods that are high in fiber, such as fresh fruits and vegetables, whole grains, and beans. °? Limit foods that are high in fat and processed sugars, such as fried and sweet foods. °· Keep all   follow-up visits as told by your health care provider. This is important. °Contact a health care provider if: °· You have chills or fever. °· You have redness, swelling, or pain around your incision. °· You have fluid or blood coming from your incision. °· Your incision feels warm to the touch. °· You have pus or a bad smell coming from your incision. °· Your incision breaks open. °· You feel dizzy or light-headed. °· You have pain or bleeding when you urinate. °· You  have persistent diarrhea. °· You have persistent nausea and vomiting. °· You have abnormal vaginal discharge. °· You have a rash. °· You have any type of abnormal reaction or you develop an allergy to your medicine. °· Your pain medicine does not help. °Get help right away if: °· You have a fever and your symptoms suddenly get worse. °· You have severe abdominal pain. °· You have shortness of breath. °· You faint. °· You have pain, swelling, or redness in your leg. °· You have heavy vaginal bleeding with blood clots. °Summary °· After your procedure, it is common to have pain, fatigue and vaginal discharge. °· Do not take baths, swim, or use a hot tub until your health care provider approves. Ask your health care provider if you can take showers. You may only be allowed to take sponge baths for bathing. °· Follow your health care provider's instructions about exercise, driving, and general activities. Ask your health care provider what activities are safe for you. °· Do not lift anything that is heavier than 10 lb (4.5 kg), or the limit that your health care provider tells you, until he or she says that it is safe. °· Try to have someone at home with you for the first 1-2 weeks to help. °This information is not intended to replace advice given to you by your health care provider. Make sure you discuss any questions you have with your health care provider. °Document Released: 03/29/2005 Document Revised: 08/28/2016 Document Reviewed: 08/28/2016 °Elsevier Interactive Patient Education © 2017 Elsevier Inc. ° °

## 2017-12-25 ENCOUNTER — Other Ambulatory Visit: Payer: Self-pay | Admitting: *Deleted

## 2017-12-25 ENCOUNTER — Ambulatory Visit (INDEPENDENT_AMBULATORY_CARE_PROVIDER_SITE_OTHER): Payer: Managed Care, Other (non HMO) | Admitting: Obstetrics & Gynecology

## 2017-12-25 ENCOUNTER — Encounter: Payer: Self-pay | Admitting: Obstetrics & Gynecology

## 2017-12-25 VITALS — BP 124/70 | HR 80 | Ht 67.0 in | Wt 224.5 lb

## 2017-12-25 DIAGNOSIS — Z9889 Other specified postprocedural states: Secondary | ICD-10-CM

## 2017-12-25 MED ORDER — OXYCODONE-ACETAMINOPHEN 7.5-325 MG PO TABS: 1.0000 | ORAL_TABLET | ORAL | 0 refills | Status: DC | PRN

## 2017-12-25 NOTE — Patient Outreach (Signed)
[facility]) Care Management  12/25/2017  KABRIA HETZER 11/19/1970 [id number]   Subjective: Telephone call to patient's home  / mobile number, no answer, left HIPAA compliant voicemail message, and requested call back.     Objective: Per KPN (Knowledge Performance Now, point of care tool) and chart review, patient hospitalized  12/17/17 -12/18/17 for fibroids and menometrorrhagia.   Status post Abdominal hysterectomy, supracervical, with removal of both tubes and ovaries, on 12/17/17.  Patient also has a history of anemia and hypertension.       Assessment: Received [employer] Transition of care referral on 12/24/17.  Transition of care follow up pending patient contact.       Plan: RNCM will call patient for 2nd telephone outreach attempt, transition of care follow up, within 10 business days if no return call.      Drevion Offord H. Annia Friendly, BSN, [place] Management [facility] Telephonic CM Phone: [phone] Fax: [phone]

## 2017-12-25 NOTE — Progress Notes (Signed)
  HPI: Patient returns for routine postoperative follow-up having undergone supracervical hysterectomy with removal of both tubes and ovaries on 12/17/2017.  The patient's immediate postoperative recovery has been unremarkable. Since hospital discharge the patient reports left sided .   Current Outpatient Medications: hydrochlorothiazide (HYDRODIURIL) 25 MG tablet, Take 25 mg by mouth daily. , Disp: , Rfl:  ketorolac (TORADOL) 10 MG tablet, Take 1 tablet (10 mg total) by mouth every 8 (eight) hours as needed., Disp: 15 tablet, Rfl: 0 losartan (COZAAR) 100 MG tablet, Take 100 mg by mouth daily. , Disp: , Rfl:   metoprolol tartrate (LOPRESSOR) 50 MG tablet, Take 50 mg by mouth daily. , Disp: , Rfl:  oxyCODONE-acetaminophen (PERCOCET) 7.5-325 MG tablet, Take 1-2 tablets by mouth every 4 (four) hours as needed for moderate pain., Disp: 30 tablet, Rfl: 0 cephALEXin (KEFLEX) 250 MG capsule, Take 1 capsule by mouth 4 (four) times daily., Disp: , Rfl:  ondansetron (ZOFRAN) 8 MG tablet, Take 1 tablet (8 mg total) by mouth every 6 (six) hours as needed for nausea. (Patient not taking: Reported on 12/25/2017), Disp: 20 tablet, Rfl: 0  No current facility-administered medications for this visit.     Blood pressure 124/70, pulse 80, height 5\' 7"  (1.702 m), weight 224 lb 8 oz (101.8 kg), last menstrual period 11/17/2017.  Physical Exam: Incision clean dry intct  Diagnostic Tests:   Pathology: benign  Impression: S/p supracervical hyst with BSO  Plan:   Follow up: 4  weeks  Florian Buff, MD

## 2017-12-26 ENCOUNTER — Ambulatory Visit: Payer: Self-pay | Admitting: *Deleted

## 2017-12-26 ENCOUNTER — Other Ambulatory Visit: Payer: Self-pay | Admitting: *Deleted

## 2017-12-26 NOTE — Patient Outreach (Signed)
[facility]) Care Management  12/26/2017  Patricia Barker 01-Aug-1971 [id number]   Subjective: Telephone call to patient's home number, spoke with patient, and HIPAA verified.  Discussed [facility] Care Management [employer] Transition of care follow up, patient voiced understanding, and is in agreement to follow up.   Patient states she is currently sleeping and requested call back.    Objective: Per KPN (Knowledge Performance Now, point of care tool) and chart review, patient hospitalized  12/17/17 -12/18/17 for fibroids and menometrorrhagia.   Status post Abdominal hysterectomy, supracervical, with removal of both tubes and ovaries, on 12/17/17.  Patient also has a history of anemia and hypertension.       Assessment: Received [employer] Transition of care referral on 12/24/17.  Transition of care follow up pending patient contact.       Plan: RNCM will send unsuccessful outreach  letter, [facility] pamphlet, will call patient for 3rd telephone outreach attempt, transition of care follow up, and proceed with case closure, within 10 business days if no return call.     Patricia Barker H. Annia Friendly, BSN, [place] Management [facility] Telephonic CM Phone: [phone] Fax: [phone]

## 2017-12-29 ENCOUNTER — Ambulatory Visit (HOSPITAL_COMMUNITY): Payer: Managed Care, Other (non HMO) | Admitting: Internal Medicine

## 2017-12-29 ENCOUNTER — Other Ambulatory Visit: Payer: Self-pay | Admitting: *Deleted

## 2017-12-29 ENCOUNTER — Ambulatory Visit: Payer: Self-pay | Admitting: *Deleted

## 2017-12-29 ENCOUNTER — Other Ambulatory Visit (HOSPITAL_COMMUNITY): Payer: Managed Care, Other (non HMO)

## 2017-12-29 NOTE — Patient Outreach (Signed)
[facility]) Care Management  12/29/2017  Patricia Barker 1971/01/06 [id number]  Subjective: Telephone call to patient's home / mobile number, spoke with patient, and HIPAA verified.  Discussed [facility] Care Management [employer] Transition of care follow up, patient voiced understanding, and is in agreement to follow up.   Patient states she is doing much better, had follow up appointment with surgeon on 12/25/17, and appointment went well. Patient states she is able to manage self care and has assistance as needed with activities of daily living / home management.  Patient voices understanding of medical diagnosis and treatment plan.  States she is accessing her Christella Scheuermann benefits as needed via member services number on back of card or through [email or web address]  Patient states she does not have any education material, transition of care, care coordination, disease management, disease monitoring, transportation, community resource, or pharmacy needs at this time.   States she is very appreciative of the follow up and is in agreement to receive [place] Management information.     Objective:Per KPN (Knowledge Performance Now, point of care tool) and chart review,patient hospitalized 12/17/17 -12/18/17 for fibroids and menometrorrhagia. Status post Abdominal hysterectomy, supracervical, with removal of both tubes and ovaries, on 12/17/17. Patient also has a history of anemia and hypertension.      Assessment: Received [employer] Transition of care referral on 12/24/17.Transition of care follow up completed, no care management needs, and will proceed with case closure.       Plan:RNCM will send patient successful outreach letter, [facility] pamphlet, and magnet. RNCM will complete case closure due to follow up completed / no care management needs.       Corina Stacy H. Annia Friendly, BSN, [place] Management [facility] Telephonic CM Phone: [phone] Fax: [phone]

## 2018-01-22 ENCOUNTER — Ambulatory Visit (INDEPENDENT_AMBULATORY_CARE_PROVIDER_SITE_OTHER): Payer: Managed Care, Other (non HMO) | Admitting: Obstetrics & Gynecology

## 2018-01-22 ENCOUNTER — Encounter: Payer: Self-pay | Admitting: Obstetrics & Gynecology

## 2018-01-22 VITALS — BP 140/94 | HR 58 | Wt 227.0 lb

## 2018-01-22 DIAGNOSIS — Z90710 Acquired absence of both cervix and uterus: Secondary | ICD-10-CM

## 2018-01-22 DIAGNOSIS — Z9889 Other specified postprocedural states: Secondary | ICD-10-CM

## 2018-01-22 MED ORDER — ESTRADIOL 2 MG PO TABS
2.0000 mg | ORAL_TABLET | Freq: Every day | ORAL | 11 refills | Status: DC
Start: 2018-01-22 — End: 2019-02-10

## 2018-01-22 NOTE — Progress Notes (Signed)
  HPI: Patient returns for routine postoperative follow-up having undergone abdominal supracervical hysterectomy BSO on 12/17/2017.  The patient's immediate postoperative recovery has been unremarkable. Since hospital discharge the patient reports no surgical  Problems, having vasomotor symptoms.   Current Outpatient Medications: ketorolac (TORADOL) 10 MG tablet, Take 1 tablet (10 mg total) by mouth every 8 (eight) hours as needed., Disp: 15 tablet, Rfl: 0 losartan (COZAAR) 100 MG tablet, Take 100 mg by mouth daily. , Disp: , Rfl:   metoprolol tartrate (LOPRESSOR) 50 MG tablet, Take 50 mg by mouth daily. , Disp: , Rfl:  hydrochlorothiazide (HYDRODIURIL) 25 MG tablet, Take 25 mg by mouth daily. , Disp: , Rfl:  ondansetron (ZOFRAN) 8 MG tablet, Take 1 tablet (8 mg total) by mouth every 6 (six) hours as needed for nausea. (Patient not taking: Reported on 01/22/2018), Disp: 20 tablet, Rfl: 0 oxyCODONE-acetaminophen (PERCOCET) 7.5-325 MG tablet, Take 1-2 tablets by mouth every 4 (four) hours as needed for moderate pain. (Patient not taking: Reported on 01/22/2018), Disp: 20 tablet, Rfl: 0  No current facility-administered medications for this visit.     Blood pressure (!) 140/94, pulse (!) 58, weight 227 lb (103 kg), last menstrual period 11/17/2017. e Physical Exam: Incision well healed Vagina well healed cuff  Diagnostic Tests:   Pathology: Benign pathology  Impression: S/P TAH BSO Hot flashes  Plan: Meds ordered this encounter  Medications  . estradiol (ESTRACE) 2 MG tablet    Sig: Take 1 tablet (2 mg total) by mouth daily.    Dispense:  30 tablet    Refill:  11     Follow up: 1  years  Florian Buff, MD

## 2018-02-06 ENCOUNTER — Encounter: Payer: Self-pay | Admitting: Obstetrics & Gynecology

## 2018-02-13 ENCOUNTER — Encounter: Admit: 2018-02-13 | Discharge: 2018-02-13 | Payer: Private Health Insurance - Indemnity

## 2018-02-13 DIAGNOSIS — E119 Type 2 diabetes mellitus without complications: ICD-10-CM

## 2018-02-13 DIAGNOSIS — E039 Hypothyroidism, unspecified: ICD-10-CM

## 2018-02-13 DIAGNOSIS — K649 Unspecified hemorrhoids: ICD-10-CM

## 2018-02-13 DIAGNOSIS — I10 Essential (primary) hypertension: ICD-10-CM

## 2018-02-13 DIAGNOSIS — J45909 Unspecified asthma, uncomplicated: Principal | ICD-10-CM

## 2018-02-13 DIAGNOSIS — E079 Disorder of thyroid, unspecified: ICD-10-CM

## 2018-02-13 DIAGNOSIS — G43909 Migraine, unspecified, not intractable, without status migrainosus: ICD-10-CM

## 2018-02-13 DIAGNOSIS — E785 Hyperlipidemia, unspecified: ICD-10-CM

## 2018-02-23 ENCOUNTER — Ambulatory Visit: Admit: 2018-02-23 | Discharge: 2018-02-23 | Payer: Private Health Insurance - Indemnity

## 2018-02-23 ENCOUNTER — Encounter: Admit: 2018-02-23 | Discharge: 2018-02-23 | Payer: Private Health Insurance - Indemnity

## 2018-02-23 DIAGNOSIS — I10 Essential (primary) hypertension: ICD-10-CM

## 2018-02-23 DIAGNOSIS — E559 Vitamin D deficiency, unspecified: ICD-10-CM

## 2018-02-23 DIAGNOSIS — E119 Type 2 diabetes mellitus without complications: ICD-10-CM

## 2018-02-23 DIAGNOSIS — E079 Disorder of thyroid, unspecified: ICD-10-CM

## 2018-02-23 DIAGNOSIS — K649 Unspecified hemorrhoids: ICD-10-CM

## 2018-02-23 DIAGNOSIS — R7309 Other abnormal glucose: Principal | ICD-10-CM

## 2018-02-23 DIAGNOSIS — G43909 Migraine, unspecified, not intractable, without status migrainosus: ICD-10-CM

## 2018-02-23 DIAGNOSIS — E785 Hyperlipidemia, unspecified: ICD-10-CM

## 2018-02-23 DIAGNOSIS — E1165 Type 2 diabetes mellitus with hyperglycemia: ICD-10-CM

## 2018-02-23 DIAGNOSIS — E782 Mixed hyperlipidemia: ICD-10-CM

## 2018-02-23 DIAGNOSIS — E039 Hypothyroidism, unspecified: ICD-10-CM

## 2018-02-23 DIAGNOSIS — E1142 Type 2 diabetes mellitus with diabetic polyneuropathy: ICD-10-CM

## 2018-02-23 DIAGNOSIS — J45909 Unspecified asthma, uncomplicated: Principal | ICD-10-CM

## 2018-02-23 DIAGNOSIS — E890 Postprocedural hypothyroidism: ICD-10-CM

## 2018-02-23 DIAGNOSIS — E669 Obesity, unspecified: ICD-10-CM

## 2018-02-23 MED ORDER — FLASH GLUCOSE SENSOR MISC KIT
PACK | ORAL | 3 refills | 30.00000 days | Status: AC
Start: 2018-02-23 — End: 2018-05-27

## 2018-02-23 MED ORDER — LIRAGLUTIDE 0.6 MG/0.1 ML (18 MG/3 ML) SC PNIJ
1.8 mg | INJECTION | Freq: Every day | SUBCUTANEOUS | 3 refills | Status: AC
Start: 2018-02-23 — End: 2018-03-06

## 2018-02-23 MED ORDER — FREESTYLE LIBRE 14 DAY READER MISC MISC
ORAL | 0 refills | Status: AC
Start: 2018-02-23 — End: 2018-05-27

## 2018-02-23 MED ORDER — EMPAGLIFLOZIN 25 MG PO TAB
25 mg | ORAL_TABLET | Freq: Every day | ORAL | 3 refills | Status: AC
Start: 2018-02-23 — End: ?

## 2018-03-03 ENCOUNTER — Encounter: Admit: 2018-03-03 | Discharge: 2018-03-04

## 2018-03-06 ENCOUNTER — Encounter: Admit: 2018-03-06 | Discharge: 2018-03-06 | Payer: Private Health Insurance - Indemnity

## 2018-03-06 MED ORDER — LIRAGLUTIDE 0.6 MG/0.1 ML (18 MG/3 ML) SC PNIJ
1.8 mg | Freq: Every day | SUBCUTANEOUS | 3 refills | Status: AC
Start: 2018-03-06 — End: 2018-05-27

## 2018-03-16 ENCOUNTER — Encounter: Admit: 2018-03-16 | Discharge: 2018-03-16 | Payer: Private Health Insurance - Indemnity

## 2018-03-20 ENCOUNTER — Telehealth: Payer: Self-pay | Admitting: Obstetrics & Gynecology

## 2018-03-20 NOTE — Telephone Encounter (Signed)
Left message @ 1:45 pm. JSY ?

## 2018-03-23 NOTE — Telephone Encounter (Signed)
Left message x 2. JSY 

## 2018-03-27 NOTE — Telephone Encounter (Signed)
Spoke with pt. Pt didn't know Estradiol was sent to pharmacy at last visit. I advised it was and advised can call pharmacy and have them fill prescription. Pt voiced understanding. [place]

## 2018-05-27 ENCOUNTER — Ambulatory Visit: Admit: 2018-05-27 | Discharge: 2018-05-27 | Payer: Private Health Insurance - Indemnity

## 2018-05-27 ENCOUNTER — Encounter: Admit: 2018-05-27 | Discharge: 2018-05-27 | Payer: Private Health Insurance - Indemnity

## 2018-05-27 DIAGNOSIS — G43909 Migraine, unspecified, not intractable, without status migrainosus: ICD-10-CM

## 2018-05-27 DIAGNOSIS — E1165 Type 2 diabetes mellitus with hyperglycemia: Principal | ICD-10-CM

## 2018-05-27 DIAGNOSIS — E039 Hypothyroidism, unspecified: ICD-10-CM

## 2018-05-27 DIAGNOSIS — I10 Essential (primary) hypertension: ICD-10-CM

## 2018-05-27 DIAGNOSIS — E785 Hyperlipidemia, unspecified: ICD-10-CM

## 2018-05-27 DIAGNOSIS — E079 Disorder of thyroid, unspecified: ICD-10-CM

## 2018-05-27 DIAGNOSIS — J45909 Unspecified asthma, uncomplicated: Principal | ICD-10-CM

## 2018-05-27 DIAGNOSIS — E119 Type 2 diabetes mellitus without complications: ICD-10-CM

## 2018-05-27 DIAGNOSIS — K649 Unspecified hemorrhoids: ICD-10-CM

## 2018-05-27 LAB — COMPREHENSIVE METABOLIC PANEL
Lab: 0.4 mg/dL (ref 0.3–1.2)
Lab: 10 U/L (ref 7–40)
Lab: 130 mg/dL — ABNORMAL HIGH (ref 70–100)
Lab: 138 MMOL/L (ref 137–147)
Lab: 28 MMOL/L (ref 21–30)
Lab: 3.4 MMOL/L — ABNORMAL LOW (ref 3.5–5.1)
Lab: 85 U/L (ref 25–110)
Lab: 9 mg/dL (ref 7–25)
Lab: 9.1 mg/dL (ref 8.5–10.6)
Lab: >60 mL/min (ref >60)
Lab: >60 mL/min (ref >60)
Lab: 7 (ref 3–12)

## 2018-05-27 LAB — MICROALB/CR RATIO-URINE RANDOM: Lab: 170 mg/dL

## 2018-05-27 LAB — TSH WITH FREE T4 REFLEX: Lab: 37 uU/mL — ABNORMAL HIGH (ref >40)

## 2018-05-27 LAB — LIPID PROFILE
Lab: 126 mg/dL — ABNORMAL HIGH (ref <100)
Lab: 193 mg/dL — ABNORMAL HIGH (ref <150)
Lab: 209 mg/dL — ABNORMAL HIGH (ref <200)

## 2018-05-27 LAB — FREE T4-FREE THYROXINE: Lab: 0.5 ng/dL — ABNORMAL LOW (ref 0.6–1.6)

## 2018-05-27 MED ORDER — SEMAGLUTIDE 0.25 MG OR 0.5 MG(2 MG/1.5 ML) SC PNIJ
.5 mg | SUBCUTANEOUS | 3 refills | Status: AC
Start: 2018-05-27 — End: ?

## 2018-05-27 MED ORDER — PEN NEEDLE, DIABETIC 32 GAUGE X 5/32" MISC NDLE
1 | Freq: Before meals | 3 refills | 30.00000 days | Status: AC
Start: 2018-05-27 — End: ?

## 2018-05-27 MED ORDER — BLOOD SUGAR DIAGNOSTIC MISC STRP
1 | ORAL_STRIP | Freq: Before meals | 3 refills | 30.00000 days | Status: AC
Start: 2018-05-27 — End: ?

## 2018-05-27 MED ORDER — ONETOUCH DELICA LANCETS 33 GAUGE MISC MISC
1 | Freq: Before meals | 11 refills | Status: AC
Start: 2018-05-27 — End: ?

## 2018-05-27 MED ORDER — FLASH GLUCOSE SCANNING READER MISC MISC
ORAL | 1 refills | 30.00000 days | Status: AC
Start: 2018-05-27 — End: ?

## 2018-05-27 MED ORDER — METFORMIN 500 MG PO TR24
500 mg | ORAL_TABLET | Freq: Two times a day (BID) | ORAL | 3 refills | Status: AC
Start: 2018-05-27 — End: ?

## 2018-05-27 MED ORDER — FLASH GLUCOSE SENSOR MISC KIT
PACK | ORAL | 11 refills | 30.00000 days | Status: AC
Start: 2018-05-27 — End: ?

## 2018-05-27 MED ORDER — INSULIN DEGLUDEC 200 UNIT/ML (3 ML) SC INPN
100 [IU] | Freq: Every day | SUBCUTANEOUS | 11 refills | 60.00000 days | Status: AC
Start: 2018-05-27 — End: ?

## 2018-05-27 MED ORDER — INSULIN LISPRO 100 UNIT/ML SC INPN
10 [IU] | Freq: Three times a day (TID) | SUBCUTANEOUS | 1 refills | 30.00000 days | Status: AC
Start: 2018-05-27 — End: 2018-08-26

## 2018-05-28 ENCOUNTER — Encounter: Admit: 2018-05-28 | Discharge: 2018-05-28 | Payer: Private Health Insurance - Indemnity

## 2018-05-28 DIAGNOSIS — G43909 Migraine, unspecified, not intractable, without status migrainosus: ICD-10-CM

## 2018-05-28 DIAGNOSIS — E079 Disorder of thyroid, unspecified: ICD-10-CM

## 2018-05-28 DIAGNOSIS — E039 Hypothyroidism, unspecified: ICD-10-CM

## 2018-05-28 DIAGNOSIS — E785 Hyperlipidemia, unspecified: ICD-10-CM

## 2018-05-28 DIAGNOSIS — K649 Unspecified hemorrhoids: ICD-10-CM

## 2018-05-28 DIAGNOSIS — I10 Essential (primary) hypertension: ICD-10-CM

## 2018-05-28 DIAGNOSIS — E119 Type 2 diabetes mellitus without complications: ICD-10-CM

## 2018-05-28 DIAGNOSIS — J45909 Unspecified asthma, uncomplicated: Principal | ICD-10-CM

## 2018-05-28 LAB — 25-OH VITAMIN D (D2 + D3): Lab: 10 ng/mL — ABNORMAL LOW (ref 30–80)

## 2018-08-26 ENCOUNTER — Ambulatory Visit: Admit: 2018-08-26 | Discharge: 2018-08-26 | Payer: Private Health Insurance - Indemnity

## 2018-08-26 ENCOUNTER — Encounter: Admit: 2018-08-26 | Discharge: 2018-08-26 | Payer: Private Health Insurance - Indemnity

## 2018-08-26 DIAGNOSIS — I10 Essential (primary) hypertension: ICD-10-CM

## 2018-08-26 DIAGNOSIS — E119 Type 2 diabetes mellitus without complications: ICD-10-CM

## 2018-08-26 DIAGNOSIS — E559 Vitamin D deficiency, unspecified: Secondary | ICD-10-CM

## 2018-08-26 DIAGNOSIS — E890 Postprocedural hypothyroidism: ICD-10-CM

## 2018-08-26 DIAGNOSIS — E785 Hyperlipidemia, unspecified: ICD-10-CM

## 2018-08-26 DIAGNOSIS — E079 Disorder of thyroid, unspecified: ICD-10-CM

## 2018-08-26 DIAGNOSIS — E1165 Type 2 diabetes mellitus with hyperglycemia: Principal | ICD-10-CM

## 2018-08-26 DIAGNOSIS — G43909 Migraine, unspecified, not intractable, without status migrainosus: ICD-10-CM

## 2018-08-26 DIAGNOSIS — J45909 Unspecified asthma, uncomplicated: Principal | ICD-10-CM

## 2018-08-26 DIAGNOSIS — E039 Hypothyroidism, unspecified: ICD-10-CM

## 2018-08-26 DIAGNOSIS — K909 Intestinal malabsorption, unspecified: ICD-10-CM

## 2018-08-26 DIAGNOSIS — K649 Unspecified hemorrhoids: ICD-10-CM

## 2018-08-26 MED ORDER — INSULIN LISPRO 100 UNIT/ML SC INPN
10 [IU] | Freq: Three times a day (TID) | SUBCUTANEOUS | 1 refills | 30.00000 days | Status: AC
Start: 2018-08-26 — End: ?

## 2018-08-27 LAB — TSH WITH FREE T4 REFLEX: Lab: 17 uU/mL — ABNORMAL HIGH (ref 0.35–5.00)

## 2018-08-27 LAB — FREE T4-FREE THYROXINE: Lab: 0.5 ng/dL — ABNORMAL LOW (ref 0.6–1.6)

## 2018-09-03 ENCOUNTER — Encounter: Admit: 2018-09-03 | Discharge: 2018-09-03 | Payer: Private Health Insurance - Indemnity

## 2018-09-04 ENCOUNTER — Encounter: Admit: 2018-09-04 | Discharge: 2018-09-04 | Payer: Private Health Insurance - Indemnity

## 2018-09-04 DIAGNOSIS — E890 Postprocedural hypothyroidism: Principal | ICD-10-CM

## 2018-09-04 MED ORDER — TIROSINT 200 MCG PO CAP
200 ug | ORAL_CAPSULE | Freq: Every day | ORAL | 3 refills | 30.00000 days | Status: AC
Start: 2018-09-04 — End: ?

## 2018-09-04 MED ORDER — LEVOTHYROXINE 100 MCG PO CAP
100 ug | ORAL_CAPSULE | Freq: Every day | ORAL | 3 refills | 30.00000 days | Status: AC
Start: 2018-09-04 — End: 2018-09-04

## 2018-09-04 MED ORDER — TIROSINT 100 MCG PO CAP
100 ug | ORAL_CAPSULE | Freq: Every day | ORAL | 3 refills | 30.00000 days | Status: AC
Start: 2018-09-04 — End: ?

## 2018-09-04 MED ORDER — LEVOTHYROXINE 200 MCG PO CAP
200 ug | ORAL_CAPSULE | Freq: Every day | ORAL | 3 refills | 30.00000 days | Status: AC
Start: 2018-09-04 — End: 2018-09-04

## 2018-09-17 ENCOUNTER — Encounter: Admit: 2018-09-17 | Discharge: 2018-09-17 | Payer: Private Health Insurance - Indemnity

## 2018-12-14 ENCOUNTER — Encounter: Admit: 2018-12-14 | Discharge: 2018-12-14 | Payer: Private Health Insurance - Indemnity

## 2018-12-14 NOTE — Telephone Encounter
She should only be on Tresiba. NOT levemir. Exactly as you said. Thanks!

## 2018-12-14 NOTE — Telephone Encounter
Received fax from [facility] on [address] in [place]  Request is for Levemir refill   Called patient to clarify because Meghan mentioned Tresiba in LOV note  Patient states she's on both Tresiba 100 units daily and Levemir 60 units in the morning and 40 units at night  Told patient to discontinue Levemir  Patient denies  Having low blood sugars  Notified pharmacy  Routing to [employer] PA-C to make her aware

## 2018-12-18 ENCOUNTER — Encounter: Admit: 2018-12-18 | Discharge: 2018-12-18 | Payer: Private Health Insurance - Indemnity

## 2018-12-18 MED ORDER — BLOOD-GLUCOSE METER MISC KIT
PACK | Freq: Four times a day (QID) | 0 refills | 50.00000 days | Status: AC
Start: 2018-12-18 — End: ?

## 2019-02-03 ENCOUNTER — Ambulatory Visit: Payer: Managed Care, Other (non HMO) | Admitting: Obstetrics and Gynecology

## 2019-02-08 ENCOUNTER — Ambulatory Visit: Payer: Managed Care, Other (non HMO) | Admitting: Obstetrics and Gynecology

## 2019-02-10 ENCOUNTER — Ambulatory Visit (INDEPENDENT_AMBULATORY_CARE_PROVIDER_SITE_OTHER): Payer: Managed Care, Other (non HMO) | Admitting: Obstetrics and Gynecology

## 2019-02-10 ENCOUNTER — Other Ambulatory Visit: Payer: Self-pay

## 2019-02-10 ENCOUNTER — Encounter: Payer: Self-pay | Admitting: Obstetrics and Gynecology

## 2019-02-10 VITALS — BP 127/76 | HR 54 | Temp 98.4°F | Ht 67.0 in | Wt 232.2 lb

## 2019-02-10 DIAGNOSIS — N898 Other specified noninflammatory disorders of vagina: Secondary | ICD-10-CM | POA: Diagnosis not present

## 2019-02-10 LAB — POCT WET PREP WITH KOH
Trichomonas, UA: NEGATIVE
Yeast Wet Prep HPF POC: NEGATIVE

## 2019-02-10 MED ORDER — METRONIDAZOLE 500 MG PO TABS: 500.0000 mg | ORAL_TABLET | Freq: Two times a day (BID) | ORAL | 0 refills | Status: AC

## 2019-02-10 NOTE — Patient Instructions (Signed)
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
## 2019-02-16 LAB — GC/CHLAMYDIA PROBE AMP
Chlamydia trachomatis, NAA: NEGATIVE
Neisseria Gonorrhoeae by PCR: NEGATIVE

## 2019-05-04 ENCOUNTER — Encounter: Admit: 2019-05-04 | Discharge: 2019-05-04

## 2019-05-04 NOTE — Telephone Encounter
Fax received from [employer] requesting prior authorization for patients Tresiba.   Attempted to complete through Cover my meds portal per fax request, unable to find patient at this time.   Fax returned requesting pharmacy update patient information and refax request.

## 2019-05-21 ENCOUNTER — Encounter: Admit: 2019-05-21 | Discharge: 2019-05-21

## 2019-05-21 NOTE — Telephone Encounter
Received  PA request from [facility] on Tresiba  Went to covermymeds completed form and attempted to  submitted to insurance  It was refused twice called Patient left message if she would like [name] to attempt PA please call with updated insurance

## 2019-12-20 ENCOUNTER — Encounter: Payer: Self-pay | Admitting: *Deleted

## 2020-01-25 ENCOUNTER — Telehealth: Payer: Self-pay | Admitting: *Deleted

## 2020-01-25 NOTE — Telephone Encounter (Signed)
Tried to call pt to inform her that her appointment needed to be changed but no vm set up.  Lmom for return call on alternate number listed in chart.

## 2020-01-26 NOTE — Telephone Encounter (Signed)
Lvm on alternate number for pt to call me back.

## 2020-01-27 ENCOUNTER — Ambulatory Visit: Payer: Managed Care, Other (non HMO)

## 2020-01-27 NOTE — Telephone Encounter (Signed)
Pt called at 2:40 PM to ask about her appointment for today 01/27/20. Pt is aware that apt was cancelled and a nurse called the numbers listed in the chart. pts contact numbers were incorrect. Pt's number was collected today pt can be reached at [phone].

## 2020-01-28 NOTE — Telephone Encounter (Signed)
Called pt and rescheduled her appointment to 04/25/2020 at 10:00.

## 2020-04-25 ENCOUNTER — Encounter: Payer: Self-pay | Admitting: *Deleted

## 2020-04-25 ENCOUNTER — Other Ambulatory Visit: Payer: Self-pay

## 2020-04-25 ENCOUNTER — Ambulatory Visit: Payer: Self-pay

## 2021-12-13 ENCOUNTER — Other Ambulatory Visit (HOSPITAL_COMMUNITY): Payer: Self-pay | Admitting: Orthopedic Surgery

## 2022-01-07 ENCOUNTER — Encounter (HOSPITAL_BASED_OUTPATIENT_CLINIC_OR_DEPARTMENT_OTHER): Payer: Self-pay | Admitting: Orthopedic Surgery

## 2022-01-07 ENCOUNTER — Other Ambulatory Visit: Payer: Self-pay

## 2022-01-14 ENCOUNTER — Other Ambulatory Visit: Payer: Self-pay

## 2022-01-14 ENCOUNTER — Encounter (HOSPITAL_COMMUNITY): Payer: Self-pay | Admitting: Anesthesiology

## 2022-01-14 ENCOUNTER — Encounter (HOSPITAL_BASED_OUTPATIENT_CLINIC_OR_DEPARTMENT_OTHER)
Admission: RE | Admit: 2022-01-14 | Discharge: 2022-01-14 | Disposition: A | Discharge status: Home / Self Care | Payer: 59 | Source: Ambulatory Visit | Attending: Orthopedic Surgery | Admitting: Orthopedic Surgery

## 2022-01-14 DIAGNOSIS — Z01812 Encounter for preprocedural laboratory examination: Secondary | ICD-10-CM | POA: Diagnosis present

## 2022-01-14 LAB — BASIC METABOLIC PANEL
Anion gap: 6 (ref 5–15)
BUN: 10 mg/dL (ref 6–20)
CO2: 28 mmol/L (ref 22–32)
Calcium: 9.2 mg/dL (ref 8.9–10.3)
Chloride: 106 mmol/L (ref 98–111)
Creatinine, Ser: 0.86 mg/dL (ref 0.44–1.00)
GFR, Estimated: >60 mL/min (ref >60)
Glucose, Bld: 87 mg/dL (ref 70–99)
Potassium: 4.3 mmol/L (ref 3.5–5.1)
Sodium: 140 mmol/L (ref 135–145)

## 2022-01-14 NOTE — Progress Notes (Signed)
Left message for Patricia Barker at Dr. Nona Dell office regarding  Dr. Grayce Sessions request for patient to be seen by cardiology prior to surgery. Pt is asymptomatic. She denies SOB, chest pain, or discomfort. ?

## 2022-01-14 NOTE — Progress Notes (Signed)

## 2022-01-14 NOTE — Progress Notes (Signed)
Preop eval: ?Consulted by PAT regarding abnormal EKG. Notable for conduction variation. Appears to have frequent accelerated junctional beats. I asked to have her hooked back up to the EKG. I observed her for a time and captured several EKGs and a rhythm strip. In discussion with the patient she states she had to "have her heart started" 3 times in the past. Once on a ambulance and twice in the ED. I asked if she had atrial fibrillation, and she seemed to think that what it was. She denies cardiology evaluation. In care everywhere, there is an encounter from 07/2021 where she had marked hypertension and dysrhythmia noted on EKG. They recommenced she see her PCP and a cardiologist which it does not appear that she did. I recommended that she be evaluated by cardiology prior to surgery. ?

## 2022-01-17 ENCOUNTER — Ambulatory Visit (HOSPITAL_BASED_OUTPATIENT_CLINIC_OR_DEPARTMENT_OTHER): Admission: RE | Admit: 2022-01-17 | Payer: 59 | Source: Home / Self Care | Admitting: Orthopedic Surgery

## 2022-01-17 DIAGNOSIS — I10 Essential (primary) hypertension: Secondary | ICD-10-CM

## 2022-01-17 SURGERY — OSTEOTOMY, CALCANEUS
Anesthesia: General | Laterality: Right

## 2022-01-22 ENCOUNTER — Ambulatory Visit (INDEPENDENT_AMBULATORY_CARE_PROVIDER_SITE_OTHER): Payer: 59 | Admitting: Cardiology

## 2022-01-22 ENCOUNTER — Telehealth: Payer: Self-pay | Admitting: Cardiology

## 2022-01-22 ENCOUNTER — Other Ambulatory Visit: Payer: Self-pay | Admitting: Cardiology

## 2022-01-22 ENCOUNTER — Ambulatory Visit (INDEPENDENT_AMBULATORY_CARE_PROVIDER_SITE_OTHER): Payer: 59

## 2022-01-22 ENCOUNTER — Encounter: Payer: Self-pay | Admitting: Cardiology

## 2022-01-22 VITALS — BP 152/88 | HR 64 | Ht 67.0 in | Wt 241.8 lb

## 2022-01-22 DIAGNOSIS — R0602 Shortness of breath: Secondary | ICD-10-CM

## 2022-01-22 DIAGNOSIS — R001 Bradycardia, unspecified: Secondary | ICD-10-CM | POA: Diagnosis not present

## 2022-01-22 DIAGNOSIS — Z01810 Encounter for preprocedural cardiovascular examination: Secondary | ICD-10-CM

## 2022-01-22 DIAGNOSIS — I10 Essential (primary) hypertension: Secondary | ICD-10-CM

## 2022-01-22 DIAGNOSIS — I441 Atrioventricular block, second degree: Secondary | ICD-10-CM

## 2022-01-22 MED ORDER — METOPROLOL TARTRATE 50 MG PO TABS: 25.0000 mg | ORAL_TABLET | Freq: Every day | ORAL | 2 refills | Status: DC

## 2022-01-22 NOTE — Telephone Encounter (Signed)
PERCERT: ? ? ?2 D Echo dx: SOB -[facility]  ? ?72 hour ZIO XT dx: bradycardia ?
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
## 2022-01-22 NOTE — Patient Instructions (Addendum)
Medication Instructions:  ?Your physician has recommended you make the following change in your medication:  ?Decrease metoprolol tartrate to 25 mg daily. Please break your 50 mg tablet in 1/2 daily ?Continue other medications the same ? ?Labwork: ?none ? ?Testing/Procedures: ?Your physician has requested that you have an echocardiogram. Echocardiography is a painless test that uses sound waves to create images of your heart. It provides your doctor with information about the size and shape of your heart and how well your heart?s chambers and valves are working. This procedure takes approximately one hour. There are no restrictions for this procedure. ?Your physician has recommended that you wear a Zio monitor.  ? ?This monitor is a medical device that records the heart?s electrical activity. Doctors most often use these monitors to diagnose arrhythmias. Arrhythmias are problems with the speed or rhythm of the heartbeat. The monitor is a small device applied to your chest. You can wear one while you do your normal daily activities. While wearing this monitor if you have any symptoms to push the button and record what you felt. Once you have worn this monitor for the period of time provider prescribed (for 3 days), you will return the monitor device in the postage paid box. Once it is returned they will download the data collected and provide [name] with a report which the provider will then review and we will call you with those results. Important tips: ? ?Avoid showering during the first 24 hours of wearing the monitor. ?Avoid excessive sweating to help maximize wear time. ?Do not submerge the device, no hot tubs, and no swimming pools. ?Keep any lotions or oils away from the patch. ?After 24 hours you may shower with the patch on. Take brief showers with your back facing the shower head.  ?Do not remove patch once it has been placed because that will interrupt data and decrease adhesive wear time. ?Push the button  when you have any symptoms and write down what you were feeling. ?Once you have completed wearing your monitor, remove and place into box which has postage paid and place in your outgoing mailbox.  ?If for some reason you have misplaced your box then call our office and we can provide another box and/or mail it off for you. ? ?Follow-Up: ?Your physician recommends that you schedule a follow-up appointment in: pending ? ?Any Other Special Instructions Will Be Listed Below (If Applicable). ? ?If you need a refill on your cardiac medications before your next appointment, please call your pharmacy. ?

## 2022-01-31 ENCOUNTER — Ambulatory Visit (INDEPENDENT_AMBULATORY_CARE_PROVIDER_SITE_OTHER): Payer: 59

## 2022-01-31 DIAGNOSIS — R0602 Shortness of breath: Secondary | ICD-10-CM | POA: Diagnosis not present

## 2022-02-01 LAB — ECHOCARDIOGRAM COMPLETE
AR max vel: 2.85 cm2
AV Area VTI: 2.81 cm2
AV Area mean vel: 2.86 cm2
AV Mean grad: 4 mmHg
AV Peak grad: 7.1 mmHg
AV Vena cont: 0.45 cm
Ao pk vel: 1.33 m/s
Area-P 1/2: 3.48 cm2
Calc EF: 62.9 %
P 1/2 time: 796 msec
S' Lateral: 3.38 cm
Single Plane A2C EF: 65.5 %
Single Plane A4C EF: 60.6 %

## 2022-02-06 ENCOUNTER — Telehealth: Payer: Self-pay | Admitting: Cardiology

## 2022-02-06 NOTE — Telephone Encounter (Signed)
? ?  Pre-operative Risk Assessment  ?  ?Patient Name: Patricia Barker  ?DOB: 05-29-1971 ?MRN: [id number]  ? ?  ? ?Request for Surgical Clearance   ? ?Procedure:   Right Gastroc Recession; Medializing Calcaneal ? ?Date of Surgery:  Clearance TBD                              ?   ?Surgeon:  Dr. Wylene Simmer ?Surgeon's Group or Practice Name:  [facility] ?Phone number:  [phone] ?Fax number:  [phone] ?  ?Type of Clearance Requested:   ?- Medical  ?  ?Type of Anesthesia:  Not Indicated ?  ?Additional requests/questions:   ? ?Signed, ?Malanie C Hildebrandt   ?02/06/2022, 12:06 PM  ? ?

## 2022-02-07 NOTE — Telephone Encounter (Signed)
   Patient Name: Patricia Barker  DOB: 07-Nov-1970 MRN: [id number]  Primary Cardiologist: Rozann Lesches, MD  Chart reviewed as part of pre-operative protocol coverage.  Patient was just seen in the office recently by Dr. Domenic Polite with plan for echo/monitor.  Per result note on monitor: "Cardiac monitor showed average heart rate 66 bpm.  Bradycardia in the 40s was noted during the early morning hours, presumably while asleep.  Importantly, she did not have any heart block or pauses.  Lopressor was already cut back."  Per result note on echo: "Echocardiogram shows normal LVEF at 65 to 70% with mild diastolic dysfunction, no significant valvular disease.  Recent cardiac monitor already reviewed.  We cut back her beta-blocker and in the absence of any heart block by cardiac monitor, she should be able to proceed with planned ankle/leg surgery at an acceptable perioperative cardiovascular risk."  I will route this message and Dr. Myles Gip office note to requesting surgeon. Please call with questions.   Charlie Pitter, PA-C 02/07/2022, 11:47 AM

## 2022-02-08 ENCOUNTER — Other Ambulatory Visit (HOSPITAL_COMMUNITY): Payer: Self-pay | Admitting: Orthopedic Surgery

## 2022-04-08 ENCOUNTER — Encounter (HOSPITAL_BASED_OUTPATIENT_CLINIC_OR_DEPARTMENT_OTHER): Payer: Self-pay | Admitting: Orthopedic Surgery

## 2022-04-08 ENCOUNTER — Other Ambulatory Visit: Payer: Self-pay

## 2022-04-09 ENCOUNTER — Encounter (HOSPITAL_BASED_OUTPATIENT_CLINIC_OR_DEPARTMENT_OTHER)
Admission: RE | Admit: 2022-04-09 | Discharge: 2022-04-09 | Disposition: A | Discharge status: Home / Self Care | Payer: 59 | Source: Ambulatory Visit | Attending: Orthopedic Surgery | Admitting: Orthopedic Surgery

## 2022-04-09 DIAGNOSIS — I10 Essential (primary) hypertension: Secondary | ICD-10-CM | POA: Insufficient documentation

## 2022-04-09 DIAGNOSIS — Z01818 Encounter for other preprocedural examination: Secondary | ICD-10-CM | POA: Insufficient documentation

## 2022-04-09 DIAGNOSIS — M6701 Short Achilles tendon (acquired), right ankle: Secondary | ICD-10-CM | POA: Diagnosis not present

## 2022-04-09 DIAGNOSIS — M76821 Posterior tibial tendinitis, right leg: Secondary | ICD-10-CM | POA: Diagnosis present

## 2022-04-09 LAB — BASIC METABOLIC PANEL
Anion gap: 7 (ref 5–15)
BUN: 12 mg/dL (ref 6–20)
CO2: 26 mmol/L (ref 22–32)
Calcium: 9.1 mg/dL (ref 8.9–10.3)
Chloride: 107 mmol/L (ref 98–111)
Creatinine, Ser: 0.93 mg/dL (ref 0.44–1.00)
GFR, Estimated: >60 mL/min (ref >60)
Glucose, Bld: 113 mg/dL — ABNORMAL HIGH (ref 70–99)
Potassium: 3.8 mmol/L (ref 3.5–5.1)
Sodium: 140 mmol/L (ref 135–145)

## 2022-04-09 NOTE — Progress Notes (Signed)

## 2022-04-11 ENCOUNTER — Encounter (HOSPITAL_BASED_OUTPATIENT_CLINIC_OR_DEPARTMENT_OTHER)
Admission: RE | Disposition: A | Discharge status: Home / Self Care | Payer: Self-pay | Source: Home / Self Care | Attending: Orthopedic Surgery

## 2022-04-11 ENCOUNTER — Encounter (HOSPITAL_BASED_OUTPATIENT_CLINIC_OR_DEPARTMENT_OTHER): Payer: Self-pay | Admitting: Orthopedic Surgery

## 2022-04-11 ENCOUNTER — Other Ambulatory Visit: Payer: Self-pay

## 2022-04-11 ENCOUNTER — Ambulatory Visit (HOSPITAL_BASED_OUTPATIENT_CLINIC_OR_DEPARTMENT_OTHER): Payer: 59

## 2022-04-11 ENCOUNTER — Ambulatory Visit (HOSPITAL_BASED_OUTPATIENT_CLINIC_OR_DEPARTMENT_OTHER): Payer: 59 | Admitting: Anesthesiology

## 2022-04-11 ENCOUNTER — Ambulatory Visit (HOSPITAL_BASED_OUTPATIENT_CLINIC_OR_DEPARTMENT_OTHER)
Admission: RE | Admit: 2022-04-11 | Discharge: 2022-04-11 | Disposition: A | Discharge status: Home / Self Care | Payer: 59 | Attending: Orthopedic Surgery | Admitting: Orthopedic Surgery

## 2022-04-11 DIAGNOSIS — M6701 Short Achilles tendon (acquired), right ankle: Secondary | ICD-10-CM

## 2022-04-11 DIAGNOSIS — M76821 Posterior tibial tendinitis, right leg: Secondary | ICD-10-CM

## 2022-04-11 DIAGNOSIS — I10 Essential (primary) hypertension: Secondary | ICD-10-CM | POA: Insufficient documentation

## 2022-04-11 DIAGNOSIS — Z01818 Encounter for other preprocedural examination: Secondary | ICD-10-CM

## 2022-04-11 HISTORY — PX: CALCANEAL OSTEOTOMY: SHX1281

## 2022-04-11 SURGERY — OSTEOTOMY, CALCANEUS
Anesthesia: General | Site: Leg Lower | Laterality: Right

## 2022-04-11 MED ORDER — 0.9 % SODIUM CHLORIDE (POUR BTL) OPTIME
TOPICAL | Status: DC | PRN
  Administered 2022-04-11: 300 mL

## 2022-04-11 MED ORDER — SODIUM CHLORIDE 0.9 % IV SOLN: INTRAVENOUS | Status: DC

## 2022-04-11 MED ORDER — CLONIDINE HCL (ANALGESIA) 100 MCG/ML EP SOLN
EPIDURAL | Status: DC | PRN
  Administered 2022-04-11 (×2): 50 ug

## 2022-04-11 MED ORDER — CEFAZOLIN SODIUM-DEXTROSE 2-4 GM/100ML-% IV SOLN
INTRAVENOUS | Status: AC
  Filled 2022-04-11: qty 100

## 2022-04-11 MED ORDER — DEXAMETHASONE SODIUM PHOSPHATE 10 MG/ML IJ SOLN
INTRAMUSCULAR | Status: AC
  Filled 2022-04-11: qty 1

## 2022-04-11 MED ORDER — ACETAMINOPHEN 500 MG PO TABS: 1000.0000 mg | ORAL_TABLET | Freq: Once | ORAL | Status: DC

## 2022-04-11 MED ORDER — LACTATED RINGERS IV SOLN: INTRAVENOUS | Status: DC

## 2022-04-11 MED ORDER — ONDANSETRON HCL 4 MG/2ML IJ SOLN
INTRAMUSCULAR | Status: AC
  Filled 2022-04-11: qty 2

## 2022-04-11 MED ORDER — MIDAZOLAM HCL 2 MG/2ML IJ SOLN
INTRAMUSCULAR | Status: AC
  Filled 2022-04-11: qty 2

## 2022-04-11 MED ORDER — CEFAZOLIN SODIUM-DEXTROSE 2-4 GM/100ML-% IV SOLN
2.0000 g | INTRAVENOUS | Status: AC
  Administered 2022-04-11: 2 g via INTRAVENOUS

## 2022-04-11 MED ORDER — ROPIVACAINE HCL 5 MG/ML IJ SOLN
INTRAMUSCULAR | Status: DC | PRN
  Administered 2022-04-11: 20 mL via PERINEURAL

## 2022-04-11 MED ORDER — VANCOMYCIN HCL 500 MG IV SOLR
INTRAVENOUS | Status: DC | PRN
  Administered 2022-04-11: 500 mg

## 2022-04-11 MED ORDER — DEXAMETHASONE SODIUM PHOSPHATE 10 MG/ML IJ SOLN
INTRAMUSCULAR | Status: DC | PRN
  Administered 2022-04-11: 10 mg via INTRAVENOUS

## 2022-04-11 MED ORDER — LIDOCAINE HCL (CARDIAC) PF 100 MG/5ML IV SOSY
PREFILLED_SYRINGE | INTRAVENOUS | Status: DC | PRN
  Administered 2022-04-11: 10 mg via INTRAVENOUS

## 2022-04-11 MED ORDER — MIDAZOLAM HCL 2 MG/2ML IJ SOLN
2.0000 mg | Freq: Once | INTRAMUSCULAR | Status: AC
  Administered 2022-04-11: 2 mg via INTRAVENOUS

## 2022-04-11 MED ORDER — LIDOCAINE 2% (20 MG/ML) 5 ML SYRINGE
INTRAMUSCULAR | Status: AC
  Filled 2022-04-11: qty 5

## 2022-04-11 MED ORDER — FENTANYL CITRATE (PF) 100 MCG/2ML IJ SOLN: 25.0000 ug | INTRAMUSCULAR | Status: DC | PRN

## 2022-04-11 MED ORDER — FENTANYL CITRATE (PF) 100 MCG/2ML IJ SOLN
INTRAMUSCULAR | Status: AC
  Filled 2022-04-11: qty 2

## 2022-04-11 MED ORDER — EPHEDRINE SULFATE (PRESSORS) 50 MG/ML IJ SOLN
INTRAMUSCULAR | Status: DC | PRN
  Administered 2022-04-11 (×2): 10 mg via INTRAVENOUS

## 2022-04-11 MED ORDER — PROPOFOL 10 MG/ML IV BOLUS
INTRAVENOUS | Status: DC | PRN
  Administered 2022-04-11: 200 mg via INTRAVENOUS

## 2022-04-11 MED ORDER — RIVAROXABAN 10 MG PO TABS: 10.0000 mg | ORAL_TABLET | Freq: Every day | ORAL | 0 refills | Status: AC

## 2022-04-11 MED ORDER — BUPIVACAINE-EPINEPHRINE (PF) 0.5% -1:200000 IJ SOLN
INTRAMUSCULAR | Status: DC | PRN
  Administered 2022-04-11: 30 mL via PERINEURAL

## 2022-04-11 MED ORDER — CELECOXIB 200 MG PO CAPS: 200.0000 mg | ORAL_CAPSULE | Freq: Once | ORAL | Status: DC

## 2022-04-11 MED ORDER — AMISULPRIDE (ANTIEMETIC) 5 MG/2ML IV SOLN: 10.0000 mg | Freq: Once | INTRAVENOUS | Status: DC | PRN

## 2022-04-11 MED ORDER — FENTANYL CITRATE (PF) 100 MCG/2ML IJ SOLN
100.0000 ug | Freq: Once | INTRAMUSCULAR | Status: AC
  Administered 2022-04-11: 50 ug via INTRAVENOUS

## 2022-04-11 MED ORDER — OXYCODONE HCL 5 MG PO TABS: 5.0000 mg | ORAL_TABLET | Freq: Four times a day (QID) | ORAL | 0 refills | Status: AC | PRN

## 2022-04-11 SURGICAL SUPPLY — 86 items
APL PRP STRL LF DISP 70% ISPRP (MISCELLANEOUS) ×1
BANDAGE ESMARK 6X9 LF (GAUZE/BANDAGES/DRESSINGS) IMPLANT
BIT DRILL 2.4 AO COUPLING CANN (BIT) ×1 IMPLANT
BLADE AVERAGE 25X9 (BLADE) IMPLANT
BLADE MICRO SAGITTAL (BLADE) ×2 IMPLANT
BLADE SURG 15 STRL LF DISP TIS (BLADE) ×2 IMPLANT
BLADE SURG 15 STRL SS (BLADE) ×4
BNDG CMPR 9X6 STRL LF SNTH (GAUZE/BANDAGES/DRESSINGS)
BNDG ELASTIC 4X5.8 VLCR STR LF (GAUZE/BANDAGES/DRESSINGS) ×2 IMPLANT
BNDG ELASTIC 6X5.8 VLCR STR LF (GAUZE/BANDAGES/DRESSINGS) ×2 IMPLANT
BNDG ESMARK 6X9 LF (GAUZE/BANDAGES/DRESSINGS)
CANISTER SUCT 1200ML W/VALVE (MISCELLANEOUS) ×2 IMPLANT
CHLORAPREP W/TINT 26 (MISCELLANEOUS) ×2 IMPLANT
COVER BACK TABLE 60X90IN (DRAPES) ×2 IMPLANT
CUFF TOURN SGL QUICK 34 (TOURNIQUET CUFF)
CUFF TRNQT CYL 34X4.125X (TOURNIQUET CUFF) IMPLANT
DRAPE EXTREMITY T 121X128X90 (DISPOSABLE) ×2 IMPLANT
DRAPE OEC MINIVIEW 54X84 (DRAPES) ×2 IMPLANT
DRAPE U-SHAPE 47X51 STRL (DRAPES) ×2 IMPLANT
DRSG MEPITEL 4X7.2 (GAUZE/BANDAGES/DRESSINGS) ×2 IMPLANT
DRSG PAD ABDOMINAL 8X10 ST (GAUZE/BANDAGES/DRESSINGS) ×4 IMPLANT
ELECT REM PT RETURN 9FT ADLT (ELECTROSURGICAL) ×2
ELECTRODE REM PT RTRN 9FT ADLT (ELECTROSURGICAL) ×1 IMPLANT
GAUZE SPONGE 4X4 12PLY STRL (GAUZE/BANDAGES/DRESSINGS) ×2 IMPLANT
GLOVE BIO SURGEON STRL SZ8 (GLOVE) ×2 IMPLANT
GLOVE BIOGEL PI IND STRL 8 (GLOVE) ×2 IMPLANT
GLOVE BIOGEL PI INDICATOR 8 (GLOVE) ×2
GLOVE ECLIPSE 8.0 STRL XLNG CF (GLOVE) ×2 IMPLANT
GOWN STRL REUS W/ TWL LRG LVL3 (GOWN DISPOSABLE) ×1 IMPLANT
GOWN STRL REUS W/ TWL XL LVL3 (GOWN DISPOSABLE) ×2 IMPLANT
GOWN STRL REUS W/TWL LRG LVL3 (GOWN DISPOSABLE) ×4
GOWN STRL REUS W/TWL XL LVL3 (GOWN DISPOSABLE) ×2
K-WIRE TROC 1.25X150 (WIRE) ×2
KIT ACCESSORY DRILL 5 (KITS) ×1 IMPLANT
KWIRE TROC 1.25X150 (WIRE) IMPLANT
NDL SUT 6 .5 CRC .975X.05 MAYO (NEEDLE) IMPLANT
NEEDLE HYPO 22GX1.5 SAFETY (NEEDLE) IMPLANT
NEEDLE MAYO TAPER (NEEDLE) ×2
NS IRRIG 1000ML POUR BTL (IV SOLUTION) ×2 IMPLANT
PACK BASIN DAY SURGERY FS (CUSTOM PROCEDURE TRAY) ×2 IMPLANT
PAD CAST 4YDX4 CTTN HI CHSV (CAST SUPPLIES) ×1 IMPLANT
PADDING CAST ABS 4INX4YD NS (CAST SUPPLIES)
PADDING CAST ABS COTTON 4X4 ST (CAST SUPPLIES) IMPLANT
PADDING CAST COTTON 4X4 STRL (CAST SUPPLIES) ×2
PADDING CAST COTTON 6X4 STRL (CAST SUPPLIES) ×2 IMPLANT
PASSER SUT SWANSON 36MM LOOP (INSTRUMENTS) IMPLANT
PENCIL SMOKE EVACUATOR (MISCELLANEOUS) ×2 IMPLANT
RETRIEVER SUT HEWSON (MISCELLANEOUS) IMPLANT
SANITIZER HAND PURELL 535ML FO (MISCELLANEOUS) ×2 IMPLANT
SCREW CANN PT 4X40 NS (Screw) IMPLANT
SCREW CANN PT 4X44 NS (Screw) IMPLANT
SCREW CANNULATED PT 4.0X40 (Screw) ×2 IMPLANT
SCREW CANNULATED PT 4.0X44 (Screw) ×2 IMPLANT
SCREW PEEK TENODESIS 6X12MM (Screw) ×1 IMPLANT
SHEET MEDIUM DRAPE 40X70 STRL (DRAPES) ×2 IMPLANT
SLEEVE SCD COMPRESS KNEE MED (STOCKING) ×2 IMPLANT
SPIKE FLUID TRANSFER (MISCELLANEOUS) IMPLANT
SPLINT FAST PLASTER 5X30 (CAST SUPPLIES) ×20
SPLINT PLASTER CAST FAST 5X30 (CAST SUPPLIES) ×20 IMPLANT
SPONGE T-LAP 18X18 ~~LOC~~+RFID (SPONGE) ×2 IMPLANT
STOCKINETTE 6  STRL (DRAPES) ×1
STOCKINETTE 6 STRL (DRAPES) ×1 IMPLANT
SUCTION FRAZIER HANDLE 10FR (MISCELLANEOUS) ×1
SUCTION TUBE FRAZIER 10FR DISP (MISCELLANEOUS) ×1 IMPLANT
SUT BONE WAX W31G (SUTURE) IMPLANT
SUT ETHIBOND 0 MO6 C/R (SUTURE) IMPLANT
SUT ETHIBOND 2 OS 4 DA (SUTURE) IMPLANT
SUT ETHILON 3 0 PS 1 (SUTURE) ×3 IMPLANT
SUT FIBERWIRE #2 38 T-5 BLUE (SUTURE)
SUT FIBERWIRE 2-0 18 17.9 3/8 (SUTURE)
SUT MERSILENE 2.0 SH NDLE (SUTURE) IMPLANT
SUT MNCRL AB 3-0 PS2 18 (SUTURE) ×2 IMPLANT
SUT VIC AB 2-0 SH 18 (SUTURE) IMPLANT
SUT VIC AB 2-0 SH 27 (SUTURE)
SUT VIC AB 2-0 SH 27XBRD (SUTURE) IMPLANT
SUT VICRYL 0 SH 27 (SUTURE) ×2 IMPLANT
SUT VICRYL 0 UR6 27IN ABS (SUTURE) IMPLANT
SUTURE FIBERWR #2 38 T-5 BLUE (SUTURE) IMPLANT
SUTURE FIBERWR 2-0 18 17.9 3/8 (SUTURE) IMPLANT
SUTURE TAPE 1.3 FIBERLOP 20 ST (SUTURE) IMPLANT
SUTURETAPE 1.3 FIBERLOOP 20 ST (SUTURE)
SYR BULB EAR ULCER 3OZ GRN STR (SYRINGE) ×2 IMPLANT
SYR CONTROL 10ML LL (SYRINGE) IMPLANT
TOWEL GREEN STERILE FF (TOWEL DISPOSABLE) ×4 IMPLANT
TUBE CONNECTING 20X1/4 (TUBING) ×2 IMPLANT
UNDERPAD 30X36 HEAVY ABSORB (UNDERPADS AND DIAPERS) ×2 IMPLANT
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
## 2022-04-11 NOTE — Anesthesia Postprocedure Evaluation (Signed)
Anesthesia Post Note  Patient: Patricia Barker  Procedure(s) Performed: Right gastroc recession; medializing calcaneus osteotomy; possible medial cuneiform osteotomy (Right: Leg Lower) Posterior Tibial Tendon Tenolysis; Flexor Digitorum Longus Tendon Transfer to Navicular (Right: Leg Lower)     Patient location during evaluation: PACU Anesthesia Type: General Level of consciousness: awake and alert Pain management: pain level controlled Vital Signs Assessment: post-procedure vital signs reviewed and stable Respiratory status: spontaneous breathing, nonlabored ventilation, respiratory function stable and patient connected to nasal cannula oxygen Cardiovascular status: blood pressure returned to baseline and stable Postop Assessment: no apparent nausea or vomiting Anesthetic complications: no   No notable events documented.  Last Vitals:  Vitals:   04/11/22 1445 04/11/22 1500  BP: 99/78 108/60  Pulse: 65 65  Resp: 18 17  Temp:    SpO2: 97% 98%    Last Pain:  Vitals:   04/11/22 1500  TempSrc:   PainSc: 0-No pain                 Tiajuana Amass

## 2022-04-11 NOTE — Op Note (Addendum)
04/11/2022  2:17 PM  PATIENT:  Patricia Barker  51 y.o. female  PRE-OPERATIVE DIAGNOSIS:   1.  Right posterior tibial tendonitis      2.  Short right achilles tendon  POST-OPERATIVE DIAGNOSIS:  same  Procedure(s): Right gastroc recession Right calcaneal osteotomy Right posterior tibial tenolysis Right FDL deep transfer to the navicular Right foot AP, lateral and Harris heel xrays  SURGEON:  Wylene Simmer, MD  ASSISTANT: none  ANESTHESIA:   General, regional  EBL:  minimal   TOURNIQUET:   Total Tourniquet Time Documented: Thigh (Right) - 63 minutes Total: Thigh (Right) - 63 minutes  COMPLICATIONS:  None apparent  DISPOSITION:  Extubated, awake and stable to recovery.  INDICATION FOR PROCEDURE: 51 year old female with a long history of worsening right foot pain due to posterior tibial tendinitis and a tight heel cord with progressive collapsing foot deformity.  She has failed nonoperative treatment to date including activity modification, oral anti-inflammatories, bracing and physical therapy.  She presents for surgical treatment of this painful flatfoot deformity.  The risks and benefits of the alternative treatment options have been discussed in detail.  The patient wishes to proceed with surgery and specifically understands risks of bleeding, infection, nerve damage, blood clots, need for additional surgery, amputation and death.   PROCEDURE IN DETAIL:  After pre operative consent was obtained, and the correct operative site was identified, the patient was brought to the operating room and placed supine on the OR table.  Anesthesia was administered.  Pre-operative antibiotics were administered.  A surgical timeout was taken.  The right lower extremity was prepped and draped in standard sterile fashion with a tourniquet around the thigh.  The extremity was elevated and the tourniquet was inflated to 250 mmHg.  An incision was made over the medial calf.  Dissection was carried  sharply down through the subcutaneous tissues taking care to protect the saphenous vein and nerve.  The superficial fascia was incised.  The gastrocnemius tendon was identified.  The sural nerve was protected.  The gastrocnemius tendon was divided from medial to lateral under direct vision.  The wound was irrigated and closed with Monocryl and nylon after sprinkling with vancomycin powder.  Attention was turned to the lateral heel where an oblique incision was made.  Dissection was carried sharply down through the subcutaneous tissues.  The periosteum was elevated.  A K wire was used to mark the osteotomy.  An oblique osteotomy was made through the isthmus of the calcaneus taking care to protect the dorsal and plantar soft tissues.  The osteotomy was mobilized with an osteotome and lamina spreader.  The tuberosity was translated medially and fixed with 2 K wires.  Radiographs confirmed appropriate medialization of the calcaneus and appropriate position of both K wires.  The K wires were then utilized to insert 4 mm cannulated screws from the [employer] 4050 cannulated screw set.  The lateral wound was then irrigated copiously after smoothing the cut surface of bone.  The wound was sprinkled with vancomycin powder and closed with nylon.  Attention was turned to the medial hindfoot.  An incision was made along the course of the posterior tibial tendon.  Dissection was carried sharply down through the subcutaneous tissues.  The tendon sheath was opened.  Careful tenolysis was performed along the posterior tibial tendon removing all tenosynovitis.  The spring ligament was noted to be intact.  The FDL tendon was identified and the floor of the posterior tibial tendon sheath.  It  was dissected free down to the knot of [place].  It was transected and whipstitched.  A drill hole was then made in the navicular tuberosity.  The tendon was pulled from plantar to dorsal through the drill hole.  The hindfoot was reduced  into slight inversion.  The tendon was then fixed with a 6 mm [facility] medical bolt.  The tendon was then further repaired to the adjacent periosteum with 0 Vicryl.  Final AP, lateral and Harris heel radiographs confirmed appropriate correction of the flatfoot deformity in appropriate position and length of all hardware.  The wounds medially were then irrigated copiously and sprinkled with vancomycin powder.  Subcutaneous tissues were approximated with Monocryl.  Skin incision was closed with nylon.  Sterile dressings were applied followed by a well-padded short leg splint.  Tourniquet was released after application of the dressings.  The patient was awakened from anesthesia and transported to the recovery room in stable condition.   FOLLOW UP PLAN: Nonweightbearing on the right lower extremity.  Follow-up in the office in 2 weeks for suture removal and conversion to a short leg cast.  Xarelto for DVT prophylaxis.   RADIOGRAPHS: AP, lateral and Harris heel radiographs of the right foot are obtained intraoperatively.  These show interval correction of the flatfoot deformity with medialization of the calcaneus.  Hardware is appropriately positioned and of the appropriate lengths.  No other acute injuries are noted.

## 2022-04-11 NOTE — Anesthesia Preprocedure Evaluation (Signed)
Anesthesia Evaluation  Patient identified by MRN, date of birth, ID band Patient awake    Reviewed: Allergy & Precautions, NPO status , Patient's Chart, lab work & pertinent test results  Airway Mallampati: II  TM Distance: >3 FB Neck ROM: Full    Dental  (+) Dental Advisory Given   Pulmonary neg pulmonary ROS,    breath sounds clear to auscultation       Cardiovascular hypertension, Pt. on medications and Pt. on home beta blockers  Rhythm:Regular Rate:Normal     Neuro/Psych  Neuromuscular disease    GI/Hepatic negative GI ROS, Neg liver ROS,   Endo/Other  negative endocrine ROS  Renal/GU negative Renal ROS     Musculoskeletal   Abdominal   Peds  Hematology negative hematology ROS (+)   Anesthesia Other Findings   Reproductive/Obstetrics                             Anesthesia Physical Anesthesia Plan  ASA: 2  Anesthesia Plan: General   Post-op Pain Management: Regional block*, Tylenol PO (pre-op)* and Celebrex PO (pre-op)*   Induction:   PONV Risk Score and Plan: 3 and Dexamethasone, Ondansetron, Midazolam and Treatment may vary due to age or medical condition  Airway Management Planned: LMA  Additional Equipment: None  Intra-op Plan:   Post-operative Plan: Extubation in OR  Informed Consent: I have reviewed the patients History and Physical, chart, labs and discussed the procedure including the risks, benefits and alternatives for the proposed anesthesia with the patient or authorized representative who has indicated his/her understanding and acceptance.     Dental advisory given  Plan Discussed with: CRNA  Anesthesia Plan Comments:         Anesthesia Quick Evaluation

## 2022-04-11 NOTE — Progress Notes (Signed)
Assisted Dr. Rob Fitzgerald with right, adductor canal, popliteal, ultrasound guided block. Side rails up, monitors on throughout procedure. See vital signs in flow sheet. Tolerated Procedure well. 

## 2022-04-11 NOTE — Anesthesia Procedure Notes (Signed)
Anesthesia Regional Block: Adductor canal block   Pre-Anesthetic Checklist: , timeout performed,  Correct Patient, Correct Site, Correct Laterality,  Correct Procedure, Correct Position, site marked,  Risks and benefits discussed,  Surgical consent,  Pre-op evaluation,  At surgeon's request and post-op pain management  Laterality: Right  Prep: chloraprep       Needles:  Injection technique: Single-shot  Needle Type: Echogenic Needle     Needle Length: 9cm  Needle Gauge: 21     Additional Needles:   Procedures:,,,, ultrasound used (permanent image in chart),,    Narrative:  Start time: 04/11/2022 11:16 AM End time: 04/11/2022 11:23 AM Injection made incrementally with aspirations every 5 mL.  Performed by: Personally  Anesthesiologist: Suzette Battiest, MD

## 2022-04-11 NOTE — Anesthesia Procedure Notes (Signed)
Procedure Name: LMA Insertion Date/Time: 04/11/2022 12:50 PM  Performed by: Maryella Shivers, CRNAPre-anesthesia Checklist: Patient identified, Emergency Drugs available, Suction available and Patient being monitored Patient Re-evaluated:Patient Re-evaluated prior to induction Oxygen Delivery Method: Circle system utilized Preoxygenation: Pre-oxygenation with 100% oxygen Induction Type: IV induction Ventilation: Mask ventilation without difficulty LMA: LMA inserted LMA Size: 4.0 Number of attempts: 1 Airway Equipment and Method: Bite block Placement Confirmation: positive ETCO2 Tube secured with: Tape Dental Injury: Teeth and Oropharynx as per pre-operative assessment

## 2022-04-11 NOTE — Transfer of Care (Signed)
Immediate Anesthesia Transfer of Care Note  Patient: BROOKELIN FELBER  Procedure(s) Performed: Right gastroc recession; medializing calcaneus osteotomy; possible medial cuneiform osteotomy (Right: Leg Lower) Posterior Tibial Tendon Tenolysis; Flexor Digitorum Longus Tendon Transfer to Navicular (Right: Leg Lower)  Patient Location: PACU  Anesthesia Type:GA combined with regional for post-op pain  Level of Consciousness: sedated  Airway & Oxygen Therapy: Patient Spontanous Breathing and Patient connected to face mask oxygen  Post-op Assessment: Report given to RN and Post -op Vital signs reviewed and stable  Post vital signs: Reviewed and stable  Last Vitals:  Vitals Value Taken Time  BP 102/65 04/11/22 1410  Temp    Pulse 67 04/11/22 1413  Resp 17 04/11/22 1413  SpO2 100 % 04/11/22 1413  Vitals shown include unvalidated device data.  Last Pain:  Vitals:   04/11/22 1017  TempSrc: Oral  PainSc: 0-No pain      Patients Stated Pain Goal: 5 (54/98/26 4158)  Complications: No notable events documented.

## 2022-04-11 NOTE — Discharge Instructions (Addendum)
Wylene Simmer, MD [facility]  Please read the following information regarding your care after surgery.  Medications  You only need a prescription for the narcotic pain medicine (ex. oxycodone, Percocet, Norco).  All of the other medicines listed below are available over the counter. X Aleve 2 pills twice a day for the first 3 days after surgery. X acetominophen (Tylenol) 650 mg every 4-6 hours as you need for minor to moderate pain X oxycodone as prescribed for severe pain  Narcotic pain medicine (ex. oxycodone, Percocet, Vicodin) will cause constipation.  To prevent this problem, take the following medicines while you are taking any pain medicine. X docusate sodium (Colace) 100 mg twice a day X senna (Senokot) 2 tablets twice a day  X To help prevent blood clots, take Xarelto once a day for two weeks after surgery.  You should also get up every hour while you are awake to move around.    Weight Bearing X Do not bear any weight on the operated leg or foot.  Cast / Splint / Dressing X Keep your splint, cast or dressing clean and dry.  Don't put anything (coat hanger, pencil, etc) down inside of it.  If it gets damp, use a hair dryer on the cool setting to dry it.  If it gets soaked, call the office to schedule an appointment for a cast change.  After your dressing, cast or splint is removed; you may shower, but do not soak or scrub the wound.  Allow the water to run over it, and then gently pat it dry.  Swelling It is normal for you to have swelling where you had surgery.  To reduce swelling and pain, keep your toes above your nose for at least 3 days after surgery.  It may be necessary to keep your foot or leg elevated for several weeks.  If it hurts, it should be elevated.  Follow Up Call my office at [phone] when you are discharged from the hospital or surgery center to schedule an appointment to be seen two weeks after surgery.  Call my office at [phone] if you develop a  fever >101.5 F, nausea, vomiting, bleeding from the surgical site or severe pain.       Post Anesthesia Home Care Instructions  Activity: Get plenty of rest for the remainder of the day. A responsible individual must stay with you for 24 hours following the procedure.  For the next 24 hours, DO NOT: -Drive a car -Paediatric nurse -Drink alcoholic beverages -Take any medication unless instructed by your physician -Make any legal decisions or sign important papers.  Meals: Start with liquid foods such as gelatin or soup. Progress to regular foods as tolerated. Avoid greasy, spicy, heavy foods. If nausea and/or vomiting occur, drink only clear liquids until the nausea and/or vomiting subsides. Call your physician if vomiting continues.  Special Instructions/Symptoms: Your throat may feel dry or sore from the anesthesia or the breathing tube placed in your throat during surgery. If this causes discomfort, gargle with warm salt water. The discomfort should disappear within 24 hours.  If you had a scopolamine patch placed behind your ear for the management of post- operative nausea and/or vomiting:  1. The medication in the patch is effective for 72 hours, after which it should be removed.  Wrap patch in a tissue and discard in the trash. Wash hands thoroughly with soap and water. 2. You may remove the patch earlier than 72 hours if you experience unpleasant side effects  which may include dry mouth, dizziness or visual disturbances. 3. Avoid touching the patch. Wash your hands with soap and water after contact with the patch.      Regional Anesthesia Blocks  1. Numbness or the inability to move the "blocked" extremity may last from 3-48 hours after placement. The length of time depends on the medication injected and your individual response to the medication. If the numbness is not going away after 48 hours, call your surgeon.  2. The extremity that is blocked will need to be protected  until the numbness is gone and the  Strength has returned. Because you cannot feel it, you will need to take extra care to avoid injury. Because it may be weak, you may have difficulty moving it or using it. You may not know what position it is in without looking at it while the block is in effect.  3. For blocks in the legs and feet, returning to weight bearing and walking needs to be done carefully. You will need to wait until the numbness is entirely gone and the strength has returned. You should be able to move your leg and foot normally before you try and bear weight or walk. You will need someone to be with you when you first try to ensure you do not fall and possibly risk injury.  4. Bruising and tenderness at the needle site are common side effects and will resolve in a few days.  5. Persistent numbness or new problems with movement should be communicated to the surgeon or the [place] [phone] [place] [phone]).

## 2022-04-11 NOTE — Anesthesia Procedure Notes (Signed)
Anesthesia Regional Block: Popliteal block   Pre-Anesthetic Checklist: , timeout performed,  Correct Patient, Correct Site, Correct Laterality,  Correct Procedure, Correct Position, site marked,  Risks and benefits discussed,  Surgical consent,  Pre-op evaluation,  At surgeon's request and post-op pain management  Laterality: Right  Prep: chloraprep       Needles:  Injection technique: Single-shot  Needle Type: Echogenic Needle     Needle Length: 9cm  Needle Gauge: 21     Additional Needles:   Procedures:,,,, ultrasound used (permanent image in chart),,    Narrative:  Start time: 04/11/2022 11:10 AM End time: 04/11/2022 11:16 AM Injection made incrementally with aspirations every 5 mL.  Performed by: Personally  Anesthesiologist: Suzette Battiest, MD

## 2022-04-15 ENCOUNTER — Encounter (HOSPITAL_BASED_OUTPATIENT_CLINIC_OR_DEPARTMENT_OTHER): Payer: Self-pay | Admitting: Orthopedic Surgery

## 2022-04-26 ENCOUNTER — Other Ambulatory Visit: Payer: Self-pay | Admitting: Cardiology

## 2022-09-10 ENCOUNTER — Other Ambulatory Visit (HOSPITAL_COMMUNITY)
Admission: RE | Admit: 2022-09-10 | Discharge: 2022-09-10 | Disposition: A | Discharge status: Home / Self Care | Payer: 59 | Source: Ambulatory Visit | Attending: Internal Medicine | Admitting: Internal Medicine

## 2022-09-10 ENCOUNTER — Other Ambulatory Visit (HOSPITAL_COMMUNITY): Payer: Self-pay | Admitting: Gerontology

## 2022-09-10 ENCOUNTER — Ambulatory Visit (HOSPITAL_COMMUNITY)
Admission: RE | Admit: 2022-09-10 | Discharge: 2022-09-10 | Disposition: A | Discharge status: Home / Self Care | Payer: 59 | Source: Ambulatory Visit | Attending: Gerontology | Admitting: Gerontology

## 2022-09-10 DIAGNOSIS — Z0001 Encounter for general adult medical examination with abnormal findings: Secondary | ICD-10-CM | POA: Diagnosis present

## 2022-09-10 DIAGNOSIS — R059 Cough, unspecified: Secondary | ICD-10-CM

## 2022-09-10 LAB — CBC WITH DIFFERENTIAL/PLATELET
Abs Immature Granulocytes: 0.02 10*3/uL (ref 0.00–0.07)
Basophils Absolute: 0 10*3/uL (ref 0.0–0.1)
Basophils Relative: 1 %
Eosinophils Absolute: 0.1 10*3/uL (ref 0.0–0.5)
Eosinophils Relative: 1 %
HCT: 36.1 % (ref 36.0–46.0)
Hemoglobin: 11.1 g/dL — ABNORMAL LOW (ref 12.0–15.0)
Immature Granulocytes: 0 %
Lymphocytes Relative: 31 %
Lymphs Abs: 1.9 10*3/uL (ref 0.7–4.0)
MCH: 22.2 pg — ABNORMAL LOW (ref 26.0–34.0)
MCHC: 30.7 g/dL (ref 30.0–36.0)
MCV: 72.3 fL — ABNORMAL LOW (ref 80.0–100.0)
Monocytes Absolute: 0.3 10*3/uL (ref 0.1–1.0)
Monocytes Relative: 6 %
Neutro Abs: 3.8 10*3/uL (ref 1.7–7.7)
Neutrophils Relative %: 61 %
Platelets: 214 10*3/uL (ref 150–400)
RBC: 4.99 MIL/uL (ref 3.87–5.11)
RDW: 18 % — ABNORMAL HIGH (ref 11.5–15.5)
WBC: 6.2 10*3/uL (ref 4.0–10.5)
nRBC: 0 % (ref 0.0–0.2)

## 2022-09-10 LAB — HEPATIC FUNCTION PANEL
ALT: 19 U/L (ref 0–44)
AST: 17 U/L (ref 15–41)
Albumin: 3.6 g/dL (ref 3.5–5.0)
Alkaline Phosphatase: 74 U/L (ref 38–126)
Bilirubin, Direct: 0.1 mg/dL (ref 0.0–0.2)
Indirect Bilirubin: 0.3 mg/dL (ref 0.3–0.9)
Total Bilirubin: 0.4 mg/dL (ref 0.3–1.2)
Total Protein: 7.5 g/dL (ref 6.5–8.1)

## 2022-09-10 LAB — LIPID PANEL
Cholesterol: 138 mg/dL (ref 0–200)
HDL: 42 mg/dL (ref >40)
LDL Cholesterol: 80 mg/dL (ref 0–99)
Total CHOL/HDL Ratio: 3.3 RATIO
Triglycerides: 81 mg/dL (ref <150)
VLDL: 16 mg/dL (ref 0–40)

## 2022-09-10 LAB — BASIC METABOLIC PANEL
Anion gap: 6 (ref 5–15)
BUN: 13 mg/dL (ref 6–20)
CO2: 27 mmol/L (ref 22–32)
Calcium: 9 mg/dL (ref 8.9–10.3)
Chloride: 106 mmol/L (ref 98–111)
Creatinine, Ser: 0.88 mg/dL (ref 0.44–1.00)
GFR, Estimated: >60 mL/min (ref >60)
Glucose, Bld: 174 mg/dL — ABNORMAL HIGH (ref 70–99)
Potassium: 3.5 mmol/L (ref 3.5–5.1)
Sodium: 139 mmol/L (ref 135–145)

## 2023-09-04 ENCOUNTER — Other Ambulatory Visit (HOSPITAL_COMMUNITY): Payer: Self-pay | Admitting: Internal Medicine

## 2023-09-04 DIAGNOSIS — Z1231 Encounter for screening mammogram for malignant neoplasm of breast: Secondary | ICD-10-CM

## 2023-09-08 ENCOUNTER — Encounter: Payer: Self-pay | Admitting: *Deleted

## 2023-09-10 ENCOUNTER — Ambulatory Visit (HOSPITAL_COMMUNITY): Payer: 59

## 2024-03-17 ENCOUNTER — Encounter (INDEPENDENT_AMBULATORY_CARE_PROVIDER_SITE_OTHER): Payer: Self-pay | Admitting: *Deleted
